# Patient Record
Sex: Male | Born: 1970 | Race: Black or African American | Hispanic: No | Marital: Single | Smoking: Never smoker
Health system: Southern US, Community
[De-identification: ages and names within clinical notes are randomized; demographics above are authoritative.]

## PROBLEM LIST (undated history)

## (undated) DIAGNOSIS — K429 Umbilical hernia without obstruction or gangrene: Secondary | ICD-10-CM

## (undated) DIAGNOSIS — R011 Cardiac murmur, unspecified: Secondary | ICD-10-CM

## (undated) DIAGNOSIS — J45909 Unspecified asthma, uncomplicated: Secondary | ICD-10-CM

## (undated) DIAGNOSIS — T7840XA Allergy, unspecified, initial encounter: Secondary | ICD-10-CM

## (undated) DIAGNOSIS — R0683 Snoring: Secondary | ICD-10-CM

## (undated) HISTORY — PX: FOOT SURGERY: SHX648

## (undated) HISTORY — DX: Cardiac murmur, unspecified: R01.1

## (undated) HISTORY — DX: Allergy, unspecified, initial encounter: T78.40XA

## (undated) HISTORY — PX: HERNIA REPAIR: SHX51

## (undated) HISTORY — DX: Unspecified asthma, uncomplicated: J45.909

---

## 2001-04-02 ENCOUNTER — Emergency Department (HOSPITAL_COMMUNITY): Admission: EM | Admit: 2001-04-02 | Discharge: 2001-04-02 | Payer: Self-pay | Admitting: Emergency Medicine

## 2001-04-02 ENCOUNTER — Encounter: Payer: Self-pay | Admitting: Emergency Medicine

## 2001-04-11 ENCOUNTER — Emergency Department (HOSPITAL_COMMUNITY): Admission: EM | Admit: 2001-04-11 | Discharge: 2001-04-11 | Payer: Self-pay | Admitting: Emergency Medicine

## 2007-12-25 ENCOUNTER — Emergency Department (HOSPITAL_COMMUNITY): Admission: EM | Admit: 2007-12-25 | Discharge: 2007-12-25 | Payer: Self-pay | Admitting: Emergency Medicine

## 2010-07-08 ENCOUNTER — Ambulatory Visit
Admission: RE | Admit: 2010-07-08 | Discharge: 2010-07-08 | Disposition: A | Discharge status: Home / Self Care | Payer: BC Managed Care – PPO | Source: Ambulatory Visit | Attending: Allergy | Admitting: Allergy

## 2010-07-08 ENCOUNTER — Other Ambulatory Visit: Payer: Self-pay | Admitting: Allergy

## 2010-07-08 DIAGNOSIS — J45909 Unspecified asthma, uncomplicated: Secondary | ICD-10-CM

## 2011-03-28 ENCOUNTER — Ambulatory Visit (INDEPENDENT_AMBULATORY_CARE_PROVIDER_SITE_OTHER): Payer: Self-pay | Admitting: Surgery

## 2011-05-02 ENCOUNTER — Other Ambulatory Visit: Payer: Self-pay | Admitting: Urology

## 2011-05-02 DIAGNOSIS — D352 Benign neoplasm of pituitary gland: Secondary | ICD-10-CM

## 2011-05-10 ENCOUNTER — Other Ambulatory Visit (HOSPITAL_COMMUNITY): Payer: BC Managed Care – PPO

## 2011-05-10 ENCOUNTER — Ambulatory Visit (HOSPITAL_COMMUNITY)
Admission: RE | Admit: 2011-05-10 | Discharge: 2011-05-10 | Disposition: A | Discharge status: Home / Self Care | Payer: BC Managed Care – PPO | Source: Ambulatory Visit | Attending: Urology | Admitting: Urology

## 2011-05-10 DIAGNOSIS — D352 Benign neoplasm of pituitary gland: Secondary | ICD-10-CM

## 2011-05-10 DIAGNOSIS — H539 Unspecified visual disturbance: Secondary | ICD-10-CM | POA: Insufficient documentation

## 2011-05-10 DIAGNOSIS — R259 Unspecified abnormal involuntary movements: Secondary | ICD-10-CM | POA: Insufficient documentation

## 2011-05-10 MED ORDER — GADOBENATE DIMEGLUMINE 529 MG/ML IV SOLN
15.0000 mL | Freq: Once | INTRAVENOUS | Status: AC | PRN
  Administered 2011-05-10: 15 mL via INTRAVENOUS

## 2011-05-10 MED ORDER — GADOBENATE DIMEGLUMINE 529 MG/ML IV SOLN: 20.0000 mL | Freq: Once | INTRAVENOUS | Status: AC | PRN

## 2011-11-09 ENCOUNTER — Other Ambulatory Visit: Payer: Self-pay | Admitting: Neurosurgery

## 2011-11-09 DIAGNOSIS — D353 Benign neoplasm of craniopharyngeal duct: Secondary | ICD-10-CM

## 2013-04-22 ENCOUNTER — Ambulatory Visit (INDEPENDENT_AMBULATORY_CARE_PROVIDER_SITE_OTHER): Payer: BC Managed Care – PPO | Admitting: Family Medicine

## 2013-04-22 VITALS — BP 110/70 | HR 61 | Temp 98.3°F | Resp 16 | Ht 71.5 in | Wt 222.0 lb

## 2013-04-22 DIAGNOSIS — J45909 Unspecified asthma, uncomplicated: Secondary | ICD-10-CM | POA: Insufficient documentation

## 2013-04-22 DIAGNOSIS — J069 Acute upper respiratory infection, unspecified: Secondary | ICD-10-CM

## 2013-04-22 MED ORDER — HYDROCOD POLST-CHLORPHEN POLST 10-8 MG/5ML PO LQCR: 5.0000 mL | Freq: Every evening | ORAL | Status: DC | PRN

## 2013-04-22 MED ORDER — CEFDINIR 300 MG PO CAPS: 300.0000 mg | ORAL_CAPSULE | Freq: Two times a day (BID) | ORAL | Status: DC

## 2013-04-22 NOTE — Progress Notes (Signed)
I have discussed this case with Ms. Gessner, NP and agree.  

## 2013-04-22 NOTE — Progress Notes (Signed)
   Subjective:    Patient ID: Tony Owens, male    DOB: 09-19-1970, 43 y.o.   MRN: [id number]  HPI Patient presents today with 6 day history of cough and runny nose. Productive cough with green sputum and green/bloody nasal drainage. Has felt dizzy. Nausea and vomiting x 3 yesterday. This occurred after taking Delsym yesterday, was able to eat and drink normally follow the vomiting. No nausea or vomiting today. Chills x 2 days.  Has had asthma for 3-4 years. Uses albuterol inhaler infrequently. Sees Dr. Romie Owens for weekly allergy injections. Has not been using Advair regularly.   Review of Systems Unknown fever, Headaches above eyes, no ear pain. SOB with wheezing- did not use inhaler.    Objective:   Physical Exam  Vitals reviewed. Constitutional: He is oriented to person, place, and time. He appears well-developed and well-nourished.  HENT:  Head: Normocephalic and atraumatic.  Right Ear: Hearing, tympanic membrane, external ear and ear canal normal.  Left Ear: Hearing, tympanic membrane, external ear and ear canal normal.  Nose: Mucosal edema and rhinorrhea present. Right sinus exhibits no maxillary sinus tenderness and no frontal sinus tenderness. Left sinus exhibits no maxillary sinus tenderness and no frontal sinus tenderness.  Mouth/Throat: Uvula is midline and mucous membranes are normal. Posterior oropharyngeal erythema present. No oropharyngeal exudate or posterior oropharyngeal edema.  Eyes: Conjunctivae are normal. Right eye exhibits no discharge. Left eye exhibits no discharge.  Neck: Normal range of motion. Neck supple.  Cardiovascular: Normal rate, regular rhythm and normal heart sounds.   Pulmonary/Chest: Effort normal and breath sounds normal.  Musculoskeletal: Normal range of motion.  Lymphadenopathy:    He has cervical adenopathy.  Neurological: He is alert and oriented to person, place, and time.  Skin: Skin is warm and dry.  Psychiatric: He has a normal mood and  affect. His behavior is normal. Judgment and thought content normal.      Assessment & Plan:  1. Upper respiratory infection - chlorpheniramine-HYDROcodone (TUSSIONEX PENNKINETIC ER) 10-8 MG/5ML LQCR; Take 5 mLs by mouth at bedtime as needed for cough (cough).  Dispense: 70 mL; Refill: 0 - cefdinir (OMNICEF) 300 MG capsule; Take 1 capsule (300 mg total) by mouth 2 (two) times daily.  Dispense: 20 capsule; Refill: 0 -continue Mucinex 2. Asthma, chronic -Provided written and verbal information regarding Asthma. Discussed albuterol and Advair use. Encouraged patient to use Advair daily and to use albuterol prn wheezing and cough (esp. Associated with exercise and with upper respiratory infection) -Follow up if worsening symptoms or no improvement in 5-7 days.  Tony Beck, FNP-BC  [facility]  04/22/2013 9:35 AM

## 2013-04-22 NOTE — Patient Instructions (Signed)
Asthma, Adult Asthma is a recurring condition in which the airways tighten and narrow. Asthma can make it difficult to breathe. It can cause coughing, wheezing, and shortness of breath. Asthma episodes (also called asthma attacks) range from minor to life-threatening. Asthma cannot be cured, but medicines and lifestyle changes can help control it. CAUSES Asthma is believed to be caused by inherited (genetic) and environmental factors, but its exact cause is unknown. Asthma may be triggered by allergens, lung infections, or irritants in the air. Asthma triggers are different for each person. Common triggers include:   Animal dander.  Dust mites.  Cockroaches.  Pollen from trees or grass.  Mold.  Smoke.  Air pollutants such as dust, household cleaners, hair sprays, aerosol sprays, paint fumes, strong chemicals, or strong odors.  Cold air, weather changes, and winds (which increase molds and pollens in the air).  Strong emotional expressions such as crying or laughing hard.  Stress.  Certain medicines (such as aspirin) or types of drugs (such as beta-blockers).  Sulfites in foods and drinks. Foods and drinks that may contain sulfites include dried fruit, potato chips, and sparkling grape juice.  Infections or inflammatory conditions such as the flu, a cold, or an inflammation of the nasal membranes (rhinitis).  Gastroesophageal reflux disease (GERD).  Exercise or strenuous activity. SYMPTOMS Symptoms may occur immediately after asthma is triggered or many hours later. Symptoms include:  Wheezing.  Excessive nighttime or early morning coughing.  Frequent or severe coughing with a common cold.  Chest tightness.  Shortness of breath. DIAGNOSIS  The diagnosis of asthma is made by a review of your medical history and a physical exam. Tests may also be performed. These may include:  Lung function studies. These tests show how much air you breath in and out.  Allergy  tests.  Imaging tests such as X-rays. TREATMENT  Asthma cannot be cured, but it can usually be controlled. Treatment involves identifying and avoiding your asthma triggers. It also involves medicines. There are 2 classes of medicine used for asthma treatment:   Controller medicines. These prevent asthma symptoms from occurring. They are usually taken every day.  Reliever or rescue medicines. These quickly relieve asthma symptoms. They are used as needed and provide short-term relief. Your health care provider will help you create an asthma action plan. An asthma action plan is a written plan for managing and treating your asthma attacks. It includes a list of your asthma triggers and how they may be avoided. It also includes information on when medicines should be taken and when their dosage should be changed. An action plan may also involve the use of a device called a peak flow meter. A peak flow meter measures how well the lungs are working. It helps you monitor your condition. HOME CARE INSTRUCTIONS   Take medicine as directed by your health care provider. Speak with your health care provider if you have questions about how or when to take the medicines.  Use a peak flow meter as directed by your health care provider. Record and keep track of readings.  Understand and use the action plan to help minimize or stop an asthma attack without needing to seek medical care.  Control your home environment in the following ways to help prevent asthma attacks:  Do not smoke. Avoid being exposed to secondhand smoke.  Change your heating and air conditioning filter regularly.  Limit your use of fireplaces and wood stoves.  Get rid of pests (such as roaches and   mice) and their droppings.  Throw away plants if you see mold on them.  Clean your floors and dust regularly. Use unscented cleaning products.  Try to have someone else vacuum for you regularly. Stay out of rooms while they are being  vacuumed and for a short while afterward. If you vacuum, use a dust mask from a hardware store, a double-layered or microfilter vacuum cleaner bag, or a vacuum cleaner with a HEPA filter.  Replace carpet with wood, tile, or vinyl flooring. Carpet can trap dander and dust.  Use allergy-proof pillows, mattress covers, and box spring covers.  Wash bed sheets and blankets every week in hot water and dry them in a dryer.  Use blankets that are made of polyester or cotton.  Clean bathrooms and kitchens with bleach. If possible, have someone repaint the walls in these rooms with mold-resistant paint. Keep out of the rooms that are being cleaned and painted.  Wash hands frequently. SEEK MEDICAL CARE IF:   You have wheezing, shortness of breath, or a cough even if taking medicine to prevent attacks.  The colored mucus you cough up (sputum) is thicker than usual.  Your sputum changes from clear or white to yellow, green, gray, or bloody.  You have any problems that may be related to the medicines you are taking (such as a rash, itching, swelling, or trouble breathing).  You are using a reliever medicine more than 2 3 times per week.  Your peak flow is still at 50 79% of you personal best after following your action plan for 1 hour. SEEK IMMEDIATE MEDICAL CARE IF:   You seem to be getting worse and are unresponsive to treatment during an asthma attack.  You are short of breath even at rest.  You get short of breath when doing very little physical activity.  You have difficulty eating, drinking, or talking due to asthma symptoms.  You develop chest pain.  You develop a fast heartbeat.  You have a bluish color to your lips or fingernails.  You are lightheaded, dizzy, or faint.  Your peak flow is less than 50% of your personal best.  You have a fever or persistent symptoms for more than 2 3 days.  You have a fever and symptoms suddenly get worse. MAKE SURE YOU:   Understand these  instructions.  Will watch your condition.  Will get help right away if you are not doing well or get worse. Document Released: 01/10/2005 Document Revised: 09/12/2012 Document Reviewed: 08/09/2012 [facility] Patient Information 2014 [place], [place]. Upper Respiratory Infection, Adult An upper respiratory infection (URI) is also sometimes known as the common cold. The upper respiratory tract includes the nose, sinuses, throat, trachea, and bronchi. Bronchi are the airways leading to the lungs. Most people improve within 1 week, but symptoms can last up to 2 weeks. A residual cough may last even longer.  CAUSES Many different viruses can infect the tissues lining the upper respiratory tract. The tissues become irritated and inflamed and often become very moist. Mucus production is also common. A cold is contagious. You can easily spread the virus to others by oral contact. This includes kissing, sharing a glass, coughing, or sneezing. Touching your mouth or nose and then touching a surface, which is then touched by another person, can also spread the virus. SYMPTOMS  Symptoms typically develop 1 to 3 days after you come in contact with a cold virus. Symptoms vary from person to person. They may include:  Runny nose.  Sneezing.  Nasal congestion.  Sinus irritation.  Sore throat.  Loss of voice (laryngitis).  Cough.  Fatigue.  Muscle aches.  Loss of appetite.  Headache.  Low-grade fever. DIAGNOSIS  You might diagnose your own cold based on familiar symptoms, since most people get a cold 2 to 3 times a year. Your caregiver can confirm this based on your exam. Most importantly, your caregiver can check that your symptoms are not due to another disease such as strep throat, sinusitis, pneumonia, asthma, or epiglottitis. Blood tests, throat tests, and X-rays are not necessary to diagnose a common cold, but they may sometimes be helpful in excluding other more serious diseases. Your  caregiver will decide if any further tests are required. RISKS AND COMPLICATIONS  You may be at risk for a more severe case of the common cold if you smoke cigarettes, have chronic heart disease (such as heart failure) or lung disease (such as asthma), or if you have a weakened immune system. The very young and very old are also at risk for more serious infections. Bacterial sinusitis, middle ear infections, and bacterial pneumonia can complicate the common cold. The common cold can worsen asthma and chronic obstructive pulmonary disease (COPD). Sometimes, these complications can require emergency medical care and may be life-threatening. PREVENTION  The best way to protect against getting a cold is to practice good hygiene. Avoid oral or hand contact with people with cold symptoms. Wash your hands often if contact occurs. There is no clear evidence that vitamin C, vitamin E, echinacea, or exercise reduces the chance of developing a cold. However, it is always recommended to get plenty of rest and practice good nutrition. TREATMENT  Treatment is directed at relieving symptoms. There is no cure. Antibiotics are not effective, because the infection is caused by a virus, not by bacteria. Treatment may include:  Increased fluid intake. Sports drinks offer valuable electrolytes, sugars, and fluids.  Breathing heated mist or steam (vaporizer or shower).  Eating chicken soup or other clear broths, and maintaining good nutrition.  Getting plenty of rest.  Using gargles or lozenges for comfort.  Controlling fevers with ibuprofen or acetaminophen as directed by your caregiver.  Increasing usage of your inhaler if you have asthma. Zinc gel and zinc lozenges, taken in the first 24 hours of the common cold, can shorten the duration and lessen the severity of symptoms. Pain medicines may help with fever, muscle aches, and throat pain. A variety of non-prescription medicines are available to treat congestion  and runny nose. Your caregiver can make recommendations and may suggest nasal or lung inhalers for other symptoms.  HOME CARE INSTRUCTIONS   Only take over-the-counter or prescription medicines for pain, discomfort, or fever as directed by your caregiver.  Use a warm mist humidifier or inhale steam from a shower to increase air moisture. This may keep secretions moist and make it easier to breathe.  Drink enough water and fluids to keep your urine clear or pale yellow.  Rest as needed.  Return to work when your temperature has returned to normal or as your caregiver advises. You may need to stay home longer to avoid infecting others. You can also use a face mask and careful hand washing to prevent spread of the virus. SEEK MEDICAL CARE IF:   After the first few days, you feel you are getting worse rather than better.  You need your caregiver's advice about medicines to control symptoms.  You develop chills, worsening shortness of breath, or  brown or red sputum. These may be signs of pneumonia.  You develop yellow or brown nasal discharge or pain in the face, especially when you bend forward. These may be signs of sinusitis.  You develop a fever, swollen neck glands, pain with swallowing, or white areas in the back of your throat. These may be signs of strep throat. SEEK IMMEDIATE MEDICAL CARE IF:   You have a fever.  You develop severe or persistent headache, ear pain, sinus pain, or chest pain.  You develop wheezing, a prolonged cough, cough up blood, or have a change in your usual mucus (if you have chronic lung disease).  You develop sore muscles or a stiff neck. Document Released: 07/06/2000 Document Revised: 04/04/2011 Document Reviewed: 05/14/2010 [facility] Patient Information 2014 [place], [place].

## 2014-06-16 ENCOUNTER — Ambulatory Visit: Payer: Self-pay | Admitting: Surgery

## 2014-06-16 NOTE — H&P (Signed)
Tony Owens 06/16/2014 3:04 PM Location: [facility] Patient #: [id number] DOB: [birth year] Single / Language: Tony Owens / Race: Black or African American Male History of Present Illness [name] A. Tiari Andringa MD; 06/16/2014 3:23 PM) Patient words: umbilical hernia   Pt sent at the request of Dr Nancy Fetter for umbilical hernia. It has been present for many years but now is causing mild to moderate pain with exertion.Pain described as burning but is very active and not slowing him down much. He has a bulge at his umbilicus. It is slowly getting larger. Some diarhea associated with his hernia. No vomiting.  The patient is a 44 year old male   Other Problems Tony Owens, [place]; 06/16/2014 3:05 PM) Asthma Back Pain Gastroesophageal Reflux Disease Heart murmur Inguinal Hernia Sleep Apnea  Past Surgical History Tony Owens, CMA; 06/16/2014 3:05 PM) Foot Surgery Right.  Diagnostic Studies History Tony Owens, [place]; 06/16/2014 3:05 PM) Colonoscopy never  Allergies Tony Owens, [place]; 06/16/2014 3:05 PM) No Known Drug Allergies 06/16/2014  Medication History Tony Owens, [place]; 06/16/2014 3:05 PM) Advair HFA (115-21MCG/ACT Aerosol, Inhalation) Active. Proventil HFA (108 (90 Base)MCG/ACT Aerosol Soln, Inhalation) Active.  Social History Tony Owens, [place]; 06/16/2014 3:05 PM) Alcohol use Remotely quit alcohol use. No caffeine use No drug use Tobacco use Never smoker.  Family History Tony Owens, [place]; 06/16/2014 3:05 PM) Colon Cancer Father. Diabetes Mellitus Brother. Thyroid problems Sister.     Review of Systems ([place]. [name]; 06/16/2014 3:05 PM) General Present- Fatigue and Night Sweats. Not Present- Appetite Loss, Chills, Fever, Weight Gain and Weight Loss. Skin Not Present- Change in Wart/Mole, Dryness, Hives, Jaundice, New Lesions, Non-Healing Wounds, Rash and Ulcer. HEENT Present- Nose Bleed, Seasonal  Allergies and Sore Throat. Not Present- Earache, Hearing Loss, Hoarseness, Oral Ulcers, Ringing in the Ears, Sinus Pain, Visual Disturbances, Wears glasses/contact lenses and Yellow Eyes. Respiratory Present- Chronic Cough and Snoring. Not Present- Bloody sputum, Difficulty Breathing and Wheezing. Cardiovascular Present- Leg Cramps and Shortness of Breath. Not Present- Chest Pain, Difficulty Breathing Lying Down, Palpitations, Rapid Heart Rate and Swelling of Extremities. Gastrointestinal Present- Abdominal Pain and Bloating. Not Present- Bloody Stool, Change in Bowel Habits, Chronic diarrhea, Constipation, Difficulty Swallowing, Excessive gas, Gets full quickly at meals, Hemorrhoids, Indigestion, Nausea, Rectal Pain and Vomiting. Male Genitourinary Not Present- Blood in Urine, Change in Urinary Stream, Frequency, Impotence, Nocturia, Painful Urination, Urgency and Urine Leakage. Musculoskeletal Present- Back Pain and Joint Pain. Not Present- Joint Stiffness, Muscle Pain, Muscle Weakness and Swelling of Extremities. Neurological Present- Decreased Memory and Headaches. Not Present- Fainting, Numbness, Seizures, Tingling, Tremor, Trouble walking and Weakness. Psychiatric Not Present- Anxiety, Bipolar, Change in Sleep Pattern, Depression, Fearful and Frequent crying. Endocrine Present- Cold Intolerance. Not Present- Excessive Hunger, Hair Changes, Heat Intolerance, Hot flashes and New Diabetes.  Vitals [employer] R. [name]; 06/16/2014 3:05 PM) 06/16/2014 3:05 PM Weight: 213 lb Height: 71in Body Surface Area: 2.2 m Body Mass Index: 29.71 kg/m BP: 124/80 (Sitting, Left Arm, Standard)     Physical Exam ([name] A. [name] MD; 06/16/2014 3:20 PM)  Eye Eyeball - Bilateral-Extraocular movements intact. Sclera/Conjunctiva - Bilateral-No scleral icterus.  Chest and Lung Exam Chest and lung exam reveals -quiet, even and easy respiratory effort with no use of accessory muscles and on  auscultation, normal breath sounds, no adventitious sounds and normal vocal resonance. Inspection Chest Wall - Normal. Back - normal.  Cardiovascular Cardiovascular examination reveals -normal heart sounds, regular rate and rhythm with no murmurs  and normal pedal pulses bilaterally.  Abdomen Note: umbilical hernia reducible second bulge just to the left of umbilicus that could be a second hernia soft reducible    Neurologic Neurologic evaluation reveals -alert and oriented x 3 with no impairment of recent or remote memory. Mental Status-Normal.  Musculoskeletal Normal Exam - Left-Upper Extremity Strength Normal and Lower Extremity Strength Normal. Normal Exam - Right-Upper Extremity Strength Normal and Lower Extremity Strength Normal.    Assessment & Plan ([name] A. Myrel Rappleye MD; 07/24/4101 0:13 PM)  UMBILICAL HERNIA WITHOUT OBSTRUCTION AND WITHOUT GANGRENE (553.1  K42.9) Impression: DISCUSED OBSERVATION VS REPAIR WITH MESH. PT DESIRES REPAIR. The risk of hernia repair include bleeding, infection, organ injury, bowel injury, bladder injury, nerve injury recurrent hernia, blood clots, worsening of underlying condition, chronic pain, mesh use, open surgery, death, and the need for other operattions. Pt agrees to proceed  Current Plans The anatomy and the physiology was discussed. The pathophysiology and natural history of the disease was discussed. Options were discussed and recommendations were made. Technique, risks, benefits, & alternatives were discussed. Risks such as stroke, heart attack, bleeding, indection, death, and other risks discussed. Questions answered. The patient agrees to proceed. Pt Education - [facility] Umbilical/ Inguinal Hernia HCI

## 2014-06-27 ENCOUNTER — Encounter (HOSPITAL_BASED_OUTPATIENT_CLINIC_OR_DEPARTMENT_OTHER): Payer: Self-pay | Admitting: *Deleted

## 2014-07-02 ENCOUNTER — Ambulatory Visit (HOSPITAL_BASED_OUTPATIENT_CLINIC_OR_DEPARTMENT_OTHER)
Admission: RE | Admit: 2014-07-02 | Discharge: 2014-07-02 | Disposition: A | Discharge status: Home / Self Care | Payer: Worker's Compensation | Source: Ambulatory Visit | Attending: Surgery | Admitting: Surgery

## 2014-07-02 ENCOUNTER — Ambulatory Visit (HOSPITAL_BASED_OUTPATIENT_CLINIC_OR_DEPARTMENT_OTHER): Payer: Worker's Compensation | Admitting: Anesthesiology

## 2014-07-02 ENCOUNTER — Encounter (HOSPITAL_BASED_OUTPATIENT_CLINIC_OR_DEPARTMENT_OTHER): Payer: Self-pay

## 2014-07-02 ENCOUNTER — Encounter (HOSPITAL_BASED_OUTPATIENT_CLINIC_OR_DEPARTMENT_OTHER)
Admission: RE | Disposition: A | Discharge status: Home / Self Care | Payer: Self-pay | Source: Ambulatory Visit | Attending: Surgery

## 2014-07-02 DIAGNOSIS — Z79899 Other long term (current) drug therapy: Secondary | ICD-10-CM | POA: Insufficient documentation

## 2014-07-02 DIAGNOSIS — J45909 Unspecified asthma, uncomplicated: Secondary | ICD-10-CM | POA: Diagnosis not present

## 2014-07-02 DIAGNOSIS — G4730 Sleep apnea, unspecified: Secondary | ICD-10-CM | POA: Insufficient documentation

## 2014-07-02 DIAGNOSIS — K429 Umbilical hernia without obstruction or gangrene: Secondary | ICD-10-CM | POA: Diagnosis present

## 2014-07-02 DIAGNOSIS — K219 Gastro-esophageal reflux disease without esophagitis: Secondary | ICD-10-CM | POA: Diagnosis not present

## 2014-07-02 DIAGNOSIS — Z7951 Long term (current) use of inhaled steroids: Secondary | ICD-10-CM | POA: Diagnosis not present

## 2014-07-02 DIAGNOSIS — Z9889 Other specified postprocedural states: Secondary | ICD-10-CM

## 2014-07-02 HISTORY — DX: Umbilical hernia without obstruction or gangrene: K42.9

## 2014-07-02 HISTORY — PX: UMBILICAL HERNIA REPAIR: SHX196

## 2014-07-02 HISTORY — PX: INSERTION OF MESH: SHX5868

## 2014-07-02 HISTORY — DX: Snoring: R06.83

## 2014-07-02 LAB — POCT HEMOGLOBIN-HEMACUE: Hemoglobin: 16.4 g/dL (ref 13.0–17.0)

## 2014-07-02 SURGERY — REPAIR, HERNIA, UMBILICAL, ADULT
Anesthesia: General | Site: Abdomen

## 2014-07-02 MED ORDER — HYDROMORPHONE HCL 1 MG/ML IJ SOLN
INTRAMUSCULAR | Status: AC
  Filled 2014-07-02: qty 1

## 2014-07-02 MED ORDER — CEFAZOLIN SODIUM-DEXTROSE 2-3 GM-% IV SOLR
INTRAVENOUS | Status: AC
  Filled 2014-07-02: qty 50

## 2014-07-02 MED ORDER — OXYCODONE HCL 5 MG PO TABS
5.0000 mg | ORAL_TABLET | Freq: Once | ORAL | Status: AC | PRN
  Administered 2014-07-02: 5 mg via ORAL

## 2014-07-02 MED ORDER — CEFAZOLIN SODIUM-DEXTROSE 2-3 GM-% IV SOLR
INTRAVENOUS | Status: DC | PRN
  Administered 2014-07-02: 2 g via INTRAVENOUS

## 2014-07-02 MED ORDER — DEXAMETHASONE SODIUM PHOSPHATE 4 MG/ML IJ SOLN
INTRAMUSCULAR | Status: DC | PRN
  Administered 2014-07-02: 10 mg via INTRAVENOUS

## 2014-07-02 MED ORDER — HYDROMORPHONE HCL 1 MG/ML IJ SOLN
0.2500 mg | INTRAMUSCULAR | Status: DC | PRN
  Administered 2014-07-02: 0.5 mg via INTRAVENOUS

## 2014-07-02 MED ORDER — MEPERIDINE HCL 25 MG/ML IJ SOLN: 6.2500 mg | INTRAMUSCULAR | Status: DC | PRN

## 2014-07-02 MED ORDER — LIDOCAINE HCL (CARDIAC) 20 MG/ML IV SOLN
INTRAVENOUS | Status: DC | PRN
  Administered 2014-07-02: 80 mg via INTRAVENOUS

## 2014-07-02 MED ORDER — ONDANSETRON HCL 4 MG/2ML IJ SOLN
INTRAMUSCULAR | Status: DC | PRN
  Administered 2014-07-02: 4 mg via INTRAVENOUS

## 2014-07-02 MED ORDER — OXYCODONE HCL 5 MG/5ML PO SOLN: 5.0000 mg | Freq: Once | ORAL | Status: AC | PRN

## 2014-07-02 MED ORDER — MIDAZOLAM HCL 2 MG/2ML IJ SOLN
INTRAMUSCULAR | Status: AC
  Filled 2014-07-02: qty 2

## 2014-07-02 MED ORDER — FENTANYL CITRATE (PF) 100 MCG/2ML IJ SOLN
50.0000 ug | INTRAMUSCULAR | Status: DC | PRN
  Administered 2014-07-02: 100 ug via INTRAVENOUS
  Administered 2014-07-02 (×2): 50 ug via INTRAVENOUS

## 2014-07-02 MED ORDER — BUPIVACAINE-EPINEPHRINE 0.25% -1:200000 IJ SOLN
INTRAMUSCULAR | Status: DC | PRN
  Administered 2014-07-02: 9 mL

## 2014-07-02 MED ORDER — OXYCODONE HCL 5 MG PO TABS
ORAL_TABLET | ORAL | Status: AC
  Filled 2014-07-02: qty 1

## 2014-07-02 MED ORDER — LIDOCAINE HCL 4 % MT SOLN
OROMUCOSAL | Status: DC | PRN
  Administered 2014-07-02: 3 mL via TOPICAL

## 2014-07-02 MED ORDER — FENTANYL CITRATE (PF) 100 MCG/2ML IJ SOLN
INTRAMUSCULAR | Status: AC
  Filled 2014-07-02: qty 6

## 2014-07-02 MED ORDER — GLYCOPYRROLATE 0.2 MG/ML IJ SOLN
0.2000 mg | Freq: Once | INTRAMUSCULAR | Status: AC | PRN
  Administered 2014-07-02: 0.4 mg via INTRAVENOUS

## 2014-07-02 MED ORDER — LACTATED RINGERS IV SOLN
INTRAVENOUS | Status: DC
  Administered 2014-07-02 (×2): via INTRAVENOUS

## 2014-07-02 MED ORDER — MIDAZOLAM HCL 2 MG/2ML IJ SOLN
1.0000 mg | INTRAMUSCULAR | Status: DC | PRN
  Administered 2014-07-02: 2 mg via INTRAVENOUS

## 2014-07-02 MED ORDER — OXYCODONE-ACETAMINOPHEN 5-325 MG PO TABS: 1.0000 | ORAL_TABLET | ORAL | Status: AC | PRN

## 2014-07-02 MED ORDER — PROPOFOL 10 MG/ML IV BOLUS
INTRAVENOUS | Status: DC | PRN
  Administered 2014-07-02: 350 mg via INTRAVENOUS

## 2014-07-02 MED ORDER — ROCURONIUM BROMIDE 100 MG/10ML IV SOLN
INTRAVENOUS | Status: DC | PRN
Start: 2014-07-02 — End: 2014-07-02
  Administered 2014-07-02: 10 mg via INTRAVENOUS
  Administered 2014-07-02: 40 mg via INTRAVENOUS

## 2014-07-02 MED ORDER — NEOSTIGMINE METHYLSULFATE 10 MG/10ML IV SOLN
INTRAVENOUS | Status: DC | PRN
  Administered 2014-07-02: 3 mg via INTRAVENOUS

## 2014-07-02 SURGICAL SUPPLY — 50 items
APL SKNCLS STERI-STRIP NONHPOA (GAUZE/BANDAGES/DRESSINGS)
BENZOIN TINCTURE PRP APPL 2/3 (GAUZE/BANDAGES/DRESSINGS) IMPLANT
BLADE CLIPPER SURG (BLADE) IMPLANT
BLADE SURG 10 STRL SS (BLADE) IMPLANT
BLADE SURG 15 STRL LF DISP TIS (BLADE) ×1 IMPLANT
BLADE SURG 15 STRL SS (BLADE) ×2
CANISTER SUCT 1200ML W/VALVE (MISCELLANEOUS) IMPLANT
CHLORAPREP W/TINT 26ML (MISCELLANEOUS) ×2 IMPLANT
COVER BACK TABLE 60X90IN (DRAPES) ×2 IMPLANT
COVER MAYO STAND STRL (DRAPES) ×2 IMPLANT
COVER SURGICAL LIGHT HANDLE (MISCELLANEOUS) ×1 IMPLANT
DECANTER SPIKE VIAL GLASS SM (MISCELLANEOUS) IMPLANT
DRAPE LAPAROTOMY TRNSV 102X78 (DRAPE) ×2 IMPLANT
DRAPE UTILITY XL STRL (DRAPES) ×2 IMPLANT
DRSG TEGADERM 4X4.75 (GAUZE/BANDAGES/DRESSINGS) IMPLANT
ELECT COATED BLADE 2.86 ST (ELECTRODE) ×2 IMPLANT
ELECT REM PT RETURN 9FT ADLT (ELECTROSURGICAL) ×2
ELECTRODE REM PT RTRN 9FT ADLT (ELECTROSURGICAL) ×1 IMPLANT
GLOVE BIOGEL M STRL SZ7.5 (GLOVE) ×1 IMPLANT
GLOVE BIOGEL PI IND STRL 8 (GLOVE) ×1 IMPLANT
GLOVE BIOGEL PI INDICATOR 8 (GLOVE) ×4
GLOVE ECLIPSE 8.0 STRL XLNG CF (GLOVE) ×2 IMPLANT
GLOVE SURG SS PI 7.5 STRL IVOR (GLOVE) ×1 IMPLANT
GOWN STRL REUS W/ TWL LRG LVL3 (GOWN DISPOSABLE) ×2 IMPLANT
GOWN STRL REUS W/ TWL XL LVL3 (GOWN DISPOSABLE) IMPLANT
GOWN STRL REUS W/TWL LRG LVL3 (GOWN DISPOSABLE) ×2
GOWN STRL REUS W/TWL XL LVL3 (GOWN DISPOSABLE) ×4
LIQUID BAND (GAUZE/BANDAGES/DRESSINGS) ×2 IMPLANT
MESH VENTRALEX ST 2.5 CRC MED (Mesh General) ×1 IMPLANT
NDL HYPO 25X1 1.5 SAFETY (NEEDLE) ×1 IMPLANT
NEEDLE HYPO 22GX1.5 SAFETY (NEEDLE) IMPLANT
NEEDLE HYPO 25X1 1.5 SAFETY (NEEDLE) ×2 IMPLANT
NS IRRIG 1000ML POUR BTL (IV SOLUTION) IMPLANT
PACK BASIN DAY SURGERY FS (CUSTOM PROCEDURE TRAY) ×2 IMPLANT
PENCIL BUTTON HOLSTER BLD 10FT (ELECTRODE) ×2 IMPLANT
SLEEVE SCD COMPRESS KNEE MED (MISCELLANEOUS) ×2 IMPLANT
STAPLER VISISTAT 35W (STAPLE) IMPLANT
STRIP CLOSURE SKIN 1/2X4 (GAUZE/BANDAGES/DRESSINGS) IMPLANT
SUT MON AB 4-0 PC3 18 (SUTURE) ×2 IMPLANT
SUT NOVA NAB DX-16 0-1 5-0 T12 (SUTURE) ×3 IMPLANT
SUT NOVA NAB GS-22 2 0 T19 (SUTURE) IMPLANT
SUT SILK 3 0 SH 30 (SUTURE) IMPLANT
SUT VIC AB 2-0 SH 27 (SUTURE) ×2
SUT VIC AB 2-0 SH 27XBRD (SUTURE) ×1 IMPLANT
SUT VICRYL 3-0 CR8 SH (SUTURE) ×2 IMPLANT
SYR CONTROL 10ML LL (SYRINGE) ×2 IMPLANT
TOWEL OR 17X24 6PK STRL BLUE (TOWEL DISPOSABLE) ×2 IMPLANT
TOWEL OR NON WOVEN STRL DISP B (DISPOSABLE) ×2 IMPLANT
TUBE CONNECTING 20X1/4 (TUBING) IMPLANT
YANKAUER SUCT BULB TIP NO VENT (SUCTIONS) IMPLANT

## 2014-07-02 NOTE — Anesthesia Preprocedure Evaluation (Signed)
Anesthesia Evaluation  Patient identified by MRN, date of birth, ID band Patient awake    Reviewed: Allergy & Precautions, NPO status , Patient's Chart, lab work & pertinent test results  Airway Mallampati: I  TM Distance: >3 FB Neck ROM: Full    Dental  (+) Teeth Intact, Dental Advisory Given   Pulmonary  breath sounds clear to auscultation        Cardiovascular Rhythm:Regular Rate:Normal     Neuro/Psych    GI/Hepatic   Endo/Other    Renal/GU      Musculoskeletal   Abdominal   Peds  Hematology   Anesthesia Other Findings   Reproductive/Obstetrics                             Anesthesia Physical Anesthesia Plan  ASA: I  Anesthesia Plan: General   Post-op Pain Management:    Induction: Intravenous  Airway Management Planned: LMA  Additional Equipment:   Intra-op Plan:   Post-operative Plan: Extubation in OR  Informed Consent: I have reviewed the patients History and Physical, chart, labs and discussed the procedure including the risks, benefits and alternatives for the proposed anesthesia with the patient or authorized representative who has indicated his/her understanding and acceptance.   Dental advisory given  Plan Discussed with: CRNA, Anesthesiologist and Surgeon  Anesthesia Plan Comments:         Anesthesia Quick Evaluation

## 2014-07-02 NOTE — Anesthesia Postprocedure Evaluation (Signed)
  Anesthesia Post-op Note  Patient: Tony Owens  Procedure(s) Performed: Procedure(s): REPAIR OF UMBILICAL HERNIA WITH MESH (N/A) INSERTION OF MESH (N/A)  Patient Location: PACU  Anesthesia Type: General   Level of Consciousness: awake, alert  and oriented  Airway and Oxygen Therapy: Patient Spontanous Breathing  Post-op Pain: mild  Post-op Assessment: Post-op Vital signs reviewed  Post-op Vital Signs: Reviewed  Last Vitals:  Filed Vitals:   07/02/14 1700  BP:   Pulse: 80  Temp: 36.6 C  Resp: 16    Complications: No apparent anesthesia complications

## 2014-07-02 NOTE — H&P (View-Only) (Signed)
Tony Owens 06/16/2014 3:04 PM Location: [facility] Patient #: [id number] DOB: 07-22-1970 Single / Language: Tony Owens / Race: Black or African American Male History of Present Illness [name] A. Mariela Rex MD; 06/16/2014 3:23 PM) Patient words: umbilical hernia   Pt sent at the request of Dr Nancy Fetter for umbilical hernia. It has been present for many years but now is causing mild to moderate pain with exertion.Pain described as burning but is very active and not slowing him down much. He has a bulge at his umbilicus. It is slowly getting larger. Some diarhea associated with his hernia. No vomiting.  The patient is a 44 year old male   Other Problems [name], [place]; 06/16/2014 3:05 PM) Asthma Back Pain Gastroesophageal Reflux Disease Heart murmur Inguinal Hernia Sleep Apnea  Past Surgical History [name], CMA; 06/16/2014 3:05 PM) Foot Surgery Right.  Diagnostic Studies History [name], [place]; 06/16/2014 3:05 PM) Colonoscopy never  Allergies [name], [place]; 06/16/2014 3:05 PM) No Known Drug Allergies 06/16/2014  Medication History [name], [place]; 06/16/2014 3:05 PM) Advair HFA (115-21MCG/ACT Aerosol, Inhalation) Active. Proventil HFA (108 (90 Base)MCG/ACT Aerosol Soln, Inhalation) Active.  Social History [name], [place]; 06/16/2014 3:05 PM) Alcohol use Remotely quit alcohol use. No caffeine use No drug use Tobacco use Never smoker.  Family History [name], [place]; 06/16/2014 3:05 PM) Colon Cancer Father. Diabetes Mellitus Brother. Thyroid problems Sister.     Review of Systems (Polo. [name]; 06/16/2014 3:05 PM) General Present- Fatigue and Night Sweats. Not Present- Appetite Loss, Chills, Fever, Weight Gain and Weight Loss. Skin Not Present- Change in Wart/Mole, Dryness, Hives, Jaundice, New Lesions, Non-Healing Wounds, Rash and Ulcer. HEENT Present- Nose Bleed, Seasonal  Allergies and Sore Throat. Not Present- Earache, Hearing Loss, Hoarseness, Oral Ulcers, Ringing in the Ears, Sinus Pain, Visual Disturbances, Wears glasses/contact lenses and Yellow Eyes. Respiratory Present- Chronic Cough and Snoring. Not Present- Bloody sputum, Difficulty Breathing and Wheezing. Cardiovascular Present- Leg Cramps and Shortness of Breath. Not Present- Chest Pain, Difficulty Breathing Lying Down, Palpitations, Rapid Heart Rate and Swelling of Extremities. Gastrointestinal Present- Abdominal Pain and Bloating. Not Present- Bloody Stool, Change in Bowel Habits, Chronic diarrhea, Constipation, Difficulty Swallowing, Excessive gas, Gets full quickly at meals, Hemorrhoids, Indigestion, Nausea, Rectal Pain and Vomiting. Male Genitourinary Not Present- Blood in Urine, Change in Urinary Stream, Frequency, Impotence, Nocturia, Painful Urination, Urgency and Urine Leakage. Musculoskeletal Present- Back Pain and Joint Pain. Not Present- Joint Stiffness, Muscle Pain, Muscle Weakness and Swelling of Extremities. Neurological Present- Decreased Memory and Headaches. Not Present- Fainting, Numbness, Seizures, Tingling, Tremor, Trouble walking and Weakness. Psychiatric Not Present- Anxiety, Bipolar, Change in Sleep Pattern, Depression, Fearful and Frequent crying. Endocrine Present- Cold Intolerance. Not Present- Excessive Hunger, Hair Changes, Heat Intolerance, Hot flashes and New Diabetes.  Vitals [employer] R. [name]; 06/16/2014 3:05 PM) 06/16/2014 3:05 PM Weight: 213 lb Height: 71in Body Surface Area: 2.2 m Body Mass Index: 29.71 kg/m BP: 124/80 (Sitting, Left Arm, Standard)     Physical Exam (Iness Pangilinan A. Yamilett Anastos MD; 06/16/2014 3:20 PM)  Eye Eyeball - Bilateral-Extraocular movements intact. Sclera/Conjunctiva - Bilateral-No scleral icterus.  Chest and Lung Exam Chest and lung exam reveals -quiet, even and easy respiratory effort with no use of accessory muscles and on  auscultation, normal breath sounds, no adventitious sounds and normal vocal resonance. Inspection Chest Wall - Normal. Back - normal.  Cardiovascular Cardiovascular examination reveals -normal heart sounds, regular rate and rhythm with no murmurs  and normal pedal pulses bilaterally.  Abdomen Note: umbilical hernia reducible second bulge just to the left of umbilicus that could be a second hernia soft reducible    Neurologic Neurologic evaluation reveals -alert and oriented x 3 with no impairment of recent or remote memory. Mental Status-Normal.  Musculoskeletal Normal Exam - Left-Upper Extremity Strength Normal and Lower Extremity Strength Normal. Normal Exam - Right-Upper Extremity Strength Normal and Lower Extremity Strength Normal.    Assessment & Plan (Omar Gayden A. Benno Brensinger MD; 6/70/1410 3:01 PM)  UMBILICAL HERNIA WITHOUT OBSTRUCTION AND WITHOUT GANGRENE (553.1  K42.9) Impression: DISCUSED OBSERVATION VS REPAIR WITH MESH. PT DESIRES REPAIR. The risk of hernia repair include bleeding, infection, organ injury, bowel injury, bladder injury, nerve injury recurrent hernia, blood clots, worsening of underlying condition, chronic pain, mesh use, open surgery, death, and the need for other operattions. Pt agrees to proceed  Current Plans The anatomy and the physiology was discussed. The pathophysiology and natural history of the disease was discussed. Options were discussed and recommendations were made. Technique, risks, benefits, & alternatives were discussed. Risks such as stroke, heart attack, bleeding, indection, death, and other risks discussed. Questions answered. The patient agrees to proceed. Pt Education - [facility] Umbilical/ Inguinal Hernia HCI

## 2014-07-02 NOTE — Anesthesia Procedure Notes (Signed)
Procedure Name: Intubation Date/Time: 07/02/2014 2:35 PM Performed by: Maryella Shivers Pre-anesthesia Checklist: Patient identified, Emergency Drugs available, Suction available and Patient being monitored Patient Re-evaluated:Patient Re-evaluated prior to inductionOxygen Delivery Method: Circle System Utilized Preoxygenation: Pre-oxygenation with 100% oxygen Intubation Type: IV induction Ventilation: Mask ventilation without difficulty Laryngoscope Size: Mac and 3 Grade View: Grade I Tube type: Oral Tube size: 8.0 mm Number of attempts: 1 Airway Equipment and Method: Stylet and Oral airway Placement Confirmation: ETT inserted through vocal cords under direct vision,  positive ETCO2 and breath sounds checked- equal and bilateral Secured at: 23 cm Tube secured with: Tape Dental Injury: Teeth and Oropharynx as per pre-operative assessment

## 2014-07-02 NOTE — Op Note (Signed)
Nishant Sneath Feb 01, 1970 [id number] 07/02/2014  Preoperative diagnosis: umbilical hernia 3 cm reducible  Postoperative diagnosis: same  Procedure: Umbilical Hernia Repair with 6 cm bard  Coated mesh   Surgeon: Erroll Luna, MD, FACS  Anesthesia: General and 0.25 % marcaine with epinephrine    Clinical History and Indications: Patient presents for repair of umbilical hernia. Risks, benefits and alternatives to surgery were discussed.The risk of hernia repair include bleeding,  Infection,   Recurrence of the hernia,  Mesh use, chronic pain,  Organ injury,  Bowel injury,  Bladder injury,   nerve injury with numbness around the incision,  Death,  and worsening of preexisting  medical problems.  The alternatives to surgery have been discussed as well..  Long term expectations of both operative and non operative treatments have been discussed.   The patient agrees to proceed.  Procedure: The patient was seen in the preoperative area and the plans for the procedure reviewed again. He  had no further questions. I marked the area of the umbilicus as the operative site. He wishes to prodeed.  The patient was taken to the operating room and after satisfactory general anesthesia had been obtained the area was clipped as needed, prepped and draped. The timeout was performed.  I used some 0.25% Marcaine with epinephrine anesthesia to help with postoperative pain management. This was infiltrated around the umbilical area and additional infiltrated as I worked.  A curvilinear incision was made on the inferior aspect of the umbilicus. The umbilical skin was elevated off of the hernia sac. The hernia sac was dissected free of the subcutaneous tissues.  There was a second fascial defect 1 cm to the left the umbilicus I connected it to the umbilical defect. Preperitoneal fat was reduced back under the fascia. The defect measured 3 cm in maximal diameter. A 6 cm circular bard coated mesh was used. This is secured  to the undersurface the fascia with #1 Novafil. I then closed the fascia of the mesh with #1 Novafil. Hemostasis was complete.  Once the repair was complete the incision was closed by using some 3-0 Vicryl subcutaneous and 4-0 Monocryl subcuticular sutures. Liquid adhesive applied  The patient tolerated the procedure well. There were no operative complications. There was minimal blood loss. All counts were correct. He was taken to the PACU in satisfactory condition.  Erroll Luna, MD, FACS 07/02/2014 3:29 PM

## 2014-07-02 NOTE — Discharge Instructions (Signed)
[facility] _______[facility] ° °UMBILICAL OR INGUINAL HERNIA REPAIR: POST OP INSTRUCTIONS ° °Always review your discharge instruction sheet given to you by the facility where your surgery was performed. °IF YOU HAVE DISABILITY OR FAMILY LEAVE FORMS, YOU MUST BRING THEM TO THE OFFICE FOR PROCESSING.   °DO NOT GIVE THEM TO YOUR DOCTOR. ° °1. A  prescription for pain medication may be given to you upon discharge.  Take your pain medication as prescribed, if needed.  If narcotic pain medicine is not needed, then you may take acetaminophen (Tylenol) or ibuprofen (Advil) as needed. °2. Take your usually prescribed medications unless otherwise directed. °3. If you need a refill on your pain medication, please contact your pharmacy.  They will contact our office to request authorization. Prescriptions will not be filled after 5 pm or on week-ends. °4. You should follow a light diet the first 24 hours after arrival home, such as soup and crackers, etc.  Be sure to include lots of fluids daily.  Resume your normal diet the day after surgery. °5. Most patients will experience some swelling and bruising around the umbilicus or in the groin and scrotum.  Ice packs and reclining will help.  Swelling and bruising can take several days to resolve.  °6. It is common to experience some constipation if taking pain medication after surgery.  Increasing fluid intake and taking a stool softener (such as Colace) will usually help or prevent this problem from occurring.  A mild laxative (Milk of Magnesia or Miralax) should be taken according to package directions if there are no bowel movements after 48 hours. °7. Unless discharge instructions indicate otherwise, you may remove your bandages 24-48 hours after surgery, and you may shower at that time.  You may have steri-strips (small skin tapes) in place directly over the incision.  These strips should be left on the skin for 7-10 days.  If your surgeon used skin glue on the  incision, you may shower in 24 hours.  The glue will flake off over the next 2-3 weeks.  Any sutures or staples will be removed at the office during your follow-up visit. °8. ACTIVITIES:  You may resume regular (light) daily activities beginning the next day--such as daily self-care, walking, climbing stairs--gradually increasing activities as tolerated.  You may have sexual intercourse when it is comfortable.  Refrain from any heavy lifting or straining until approved by your doctor. °a. You may drive when you are no longer taking prescription pain medication, you can comfortably wear a seatbelt, and you can safely maneuver your car and apply brakes. °b. RETURN TO WORK:  __________________________________________________________ °9. You should see your doctor in the office for a follow-up appointment approximately 2-3 weeks after your surgery.  Make sure that you call for this appointment within a day or two after you arrive home to insure a convenient appointment time. °10. OTHER INSTRUCTIONS:  __________________________________________________________________________________________________________________________________________________________________________________________  °WHEN TO CALL YOUR DOCTOR: °1. Fever over 101.0 °2. Inability to urinate °3. Nausea and/or vomiting °4. Extreme swelling or bruising °5. Continued bleeding from incision. °6. Increased pain, redness, or drainage from the incision ° °The clinic staff is available to answer your questions during regular business hours.  Please don’t hesitate to call and ask to speak to one of the nurses for clinical concerns.  If you have a medical emergency, go to the nearest emergency room or call 911.  A surgeon from [facility] is always on call at the hospital ° ° °  [address] [place], [place], [place]  [address] ?  [address], [place], [place]   [address] [phone] ? [phone] ? FAX [phone] Web site:  [email or web address]    Post Anesthesia Home Care Instructions  Activity: Get plenty of rest for the remainder of the day. A responsible adult should stay with you for 24 hours following the procedure.  For the next 24 hours, DO NOT: -Drive a car -Paediatric nurse -Drink alcoholic beverages -Take any medication unless instructed by your physician -Make any legal decisions or sign important papers.  Meals: Start with liquid foods such as gelatin or soup. Progress to regular foods as tolerated. Avoid greasy, spicy, heavy foods. If nausea and/or vomiting occur, drink only clear liquids until the nausea and/or vomiting subsides. Call your physician if vomiting continues.  Special Instructions/Symptoms: Your throat may feel dry or sore from the anesthesia or the breathing tube placed in your throat during surgery. If this causes discomfort, gargle with warm salt water. The discomfort should disappear within 24 hours.  If you had a scopolamine patch placed behind your ear for the management of post- operative nausea and/or vomiting:  1. The medication in the patch is effective for 72 hours, after which it should be removed.  Wrap patch in a tissue and discard in the trash. Wash hands thoroughly with soap and water. 2. You may remove the patch earlier than 72 hours if you experience unpleasant side effects which may include dry mouth, dizziness or visual disturbances. 3. Avoid touching the patch. Wash your hands with soap and water after contact with the patch.    Call your surgeon if you experience:   1.  Fever over 101.0. 2.  Inability to urinate. 3.  Nausea and/or vomiting. 4.  Extreme swelling or bruising at the surgical site. 5.  Continued bleeding from the incision. 6.  Increased pain, redness or drainage from the incision. 7.  Problems related to your pain medication. 8. Any change in color, movement and/or sensation 9. Any problems and/or concernsCall your surgeon  if you experience:

## 2014-07-02 NOTE — Interval H&P Note (Signed)
History and Physical Interval Note:  07/02/2014 1:58 PM  Tony Owens  has presented today for surgery, with the diagnosis of Umbilical Hernia  The various methods of treatment have been discussed with the patient and family. After consideration of risks, benefits and other options for treatment, the patient has consented to  Procedure(s): REPAIR OF UMBILICAL HERNIA WITH MESH (N/A) INSERTION OF MESH (N/A) as a surgical intervention .  The patient's history has been reviewed, patient examined, no change in status, stable for surgery.  I have reviewed the patient's chart and labs.  Questions were answered to the patient's satisfaction.     Gregorio Worley A.

## 2014-07-02 NOTE — Transfer of Care (Signed)
Immediate Anesthesia Transfer of Care Note  Patient: Tony Owens  Procedure(s) Performed: Procedure(s): REPAIR OF UMBILICAL HERNIA WITH MESH (N/A) INSERTION OF MESH (N/A)  Patient Location: PACU  Anesthesia Type:General  Level of Consciousness: awake, alert  and oriented  Airway & Oxygen Therapy: Patient Spontanous Breathing and Patient connected to face mask oxygen  Post-op Assessment: Report given to RN and Post -op Vital signs reviewed and stable  Post vital signs: Reviewed and stable  Last Vitals:  Filed Vitals:   07/02/14 1252  BP: 117/71  Pulse: 67  Temp: 36.8 C  Resp: 20    Complications: No apparent anesthesia complications

## 2014-07-03 ENCOUNTER — Emergency Department (HOSPITAL_COMMUNITY)
Admission: EM | Admit: 2014-07-03 | Discharge: 2014-07-04 | Disposition: A | Discharge status: Home / Self Care | Payer: Worker's Compensation | Attending: Emergency Medicine | Admitting: Emergency Medicine

## 2014-07-03 ENCOUNTER — Emergency Department (HOSPITAL_COMMUNITY): Payer: Worker's Compensation

## 2014-07-03 ENCOUNTER — Encounter (HOSPITAL_BASED_OUTPATIENT_CLINIC_OR_DEPARTMENT_OTHER): Payer: Self-pay | Admitting: Surgery

## 2014-07-03 DIAGNOSIS — K5900 Constipation, unspecified: Secondary | ICD-10-CM

## 2014-07-03 DIAGNOSIS — R109 Unspecified abdominal pain: Secondary | ICD-10-CM | POA: Diagnosis present

## 2014-07-03 DIAGNOSIS — Z8719 Personal history of other diseases of the digestive system: Secondary | ICD-10-CM | POA: Diagnosis not present

## 2014-07-03 DIAGNOSIS — R011 Cardiac murmur, unspecified: Secondary | ICD-10-CM | POA: Diagnosis not present

## 2014-07-03 DIAGNOSIS — J45909 Unspecified asthma, uncomplicated: Secondary | ICD-10-CM | POA: Diagnosis not present

## 2014-07-03 DIAGNOSIS — G8918 Other acute postprocedural pain: Secondary | ICD-10-CM

## 2014-07-03 LAB — CBC WITH DIFFERENTIAL/PLATELET
Basophils Absolute: 0 10*3/uL (ref 0.0–0.1)
Basophils Relative: 0 % (ref 0–1)
EOS ABS: 0 10*3/uL (ref 0.0–0.7)
Eosinophils Relative: 0 % (ref 0–5)
HEMATOCRIT: 44 % (ref 39.0–52.0)
Hemoglobin: 15.3 g/dL (ref 13.0–17.0)
LYMPHS ABS: 2.1 10*3/uL (ref 0.7–4.0)
LYMPHS PCT: 16 % (ref 12–46)
MCH: 28.7 pg (ref 26.0–34.0)
MCHC: 34.8 g/dL (ref 30.0–36.0)
MCV: 82.4 fL (ref 78.0–100.0)
Monocytes Absolute: 1 10*3/uL (ref 0.1–1.0)
Monocytes Relative: 8 % (ref 3–12)
Neutro Abs: 9.7 10*3/uL — ABNORMAL HIGH (ref 1.7–7.7)
Neutrophils Relative %: 76 % (ref 43–77)
PLATELETS: 227 10*3/uL (ref 150–400)
RBC: 5.34 MIL/uL (ref 4.22–5.81)
RDW: 13.8 % (ref 11.5–15.5)
WBC: 12.8 10*3/uL — ABNORMAL HIGH (ref 4.0–10.5)

## 2014-07-03 MED ORDER — MORPHINE SULFATE 4 MG/ML IJ SOLN
4.0000 mg | Freq: Once | INTRAMUSCULAR | Status: AC
  Administered 2014-07-03: 4 mg via INTRAVENOUS
  Filled 2014-07-03: qty 1

## 2014-07-03 MED ORDER — ONDANSETRON HCL 4 MG/2ML IJ SOLN
4.0000 mg | Freq: Once | INTRAMUSCULAR | Status: AC
  Administered 2014-07-03: 4 mg via INTRAVENOUS
  Filled 2014-07-03: qty 2

## 2014-07-03 NOTE — ED Provider Notes (Signed)
CSN: [id number]     Arrival date & time 07/03/14  2210 History   First MD Initiated Contact with Patient 07/03/14 2226     Chief Complaint  Patient presents with  . Abdominal Pain     (Consider location/radiation/quality/duration/timing/severity/associated sxs/prior Treatment) HPI Patient is a 44 year old male with past medical history of umbilical hernia, recently repaired yesterday who presents the ER with complaint of mid abdominal "cramping" along with associated nausea, vomiting. Patient states his symptoms began this afternoon acutely. He reports multiple episodes of nonbilious vomiting with the last episode of vomiting having red streaks in it. Patient reports he has not had a bowel movement since the procedure yesterday. Patient denies any fever, chills, dysuria, chest pain, shortness of breath.  Past Medical History  Diagnosis Date  . Allergy   . Asthma   . Heart murmur   . Umbilical hernia   . Snores    Past Surgical History  Procedure Laterality Date  . Foot surgery Left   . Umbilical hernia repair N/A 07/02/2014    Procedure: REPAIR OF UMBILICAL HERNIA WITH MESH;  Surgeon: Erroll Luna, MD;  Location: [facility];  Service: General;  Laterality: N/A;  . Insertion of mesh N/A 07/02/2014    Procedure: INSERTION OF MESH;  Surgeon: Erroll Luna, MD;  Location: [facility];  Service: General;  Laterality: N/A;   No family history on file. History  Substance Use Topics  . Smoking status: Never Smoker   . Smokeless tobacco: Not on file  . Alcohol Use: No    Review of Systems  Constitutional: Negative for fever.  HENT: Negative for trouble swallowing.   Eyes: Negative for visual disturbance.  Respiratory: Negative for shortness of breath.   Cardiovascular: Negative for chest pain.  Gastrointestinal: Positive for nausea, vomiting and abdominal pain.  Genitourinary: Negative for dysuria.  Musculoskeletal: Negative for neck pain.  Skin:  Negative for rash.  Neurological: Negative for dizziness, weakness and numbness.  Psychiatric/Behavioral: Negative.     Allergies  Review of patient's allergies indicates no known allergies.  Home Medications   Prior to Admission medications   Medication Sig Start Date End Date Taking? Authorizing Provider  fluticasone-salmeterol (ADVAIR HFA) 115-21 MCG/ACT inhaler Inhale 2 puffs into the lungs every morning.    Yes Historical Provider, MD  oxyCODONE-acetaminophen (ROXICET) 5-325 MG per tablet Take 1-2 tablets by mouth every 4 (four) hours as needed. Patient taking differently: Take 1-2 tablets by mouth every 4 (four) hours as needed for moderate pain.  07/02/14  Yes Erroll Luna, MD  ondansetron (ZOFRAN) 4 MG tablet Take 1 tablet (4 mg total) by mouth every 8 (eight) hours as needed for nausea or vomiting. 07/04/14   Dahlia Bailiff, PA-C  polyethylene glycol (MIRALAX / GLYCOLAX) packet Take 17 g by mouth daily. 07/04/14   Dahlia Bailiff, PA-C   BP 121/69 mmHg  Pulse 73  Temp(Src) 97.6 F (36.4 C) (Oral)  Resp 16  SpO2 97% Physical Exam  Constitutional: He is oriented to person, place, and time. He appears well-developed and well-nourished. No distress.  HENT:  Head: Normocephalic and atraumatic.  Mouth/Throat: Oropharynx is clear and moist. No oropharyngeal exudate.  Eyes: Right eye exhibits no discharge. Left eye exhibits no discharge. No scleral icterus.  Neck: Normal range of motion.  Cardiovascular: Normal rate, regular rhythm and normal heart sounds.   No murmur heard. Pulmonary/Chest: Effort normal and breath sounds normal. No respiratory distress.  Abdominal: Soft. There is no tenderness. There  is no rigidity, no guarding, no tenderness at McBurney's point and negative Murphy's sign.  Surgical incision noted at umbilicus. Incision is closed, Dermabond covering. There is mild erythema and mild tenderness. No obvious purulent discharge, edema, signs of infection or dehiscence.   Musculoskeletal: Normal range of motion. He exhibits no edema or tenderness.  Neurological: He is alert and oriented to person, place, and time. No cranial nerve deficit. Coordination normal.  Skin: Skin is warm and dry. No rash noted. He is not diaphoretic.  Psychiatric: He has a normal mood and affect.  Nursing note and vitals reviewed.   ED Course  Procedures (including critical care time) Labs Review Labs Reviewed  CBC WITH DIFFERENTIAL/PLATELET - Abnormal; Notable for the following:    WBC 12.8 (*)    Neutro Abs 9.7 (*)    All other components within normal limits  LIPASE, BLOOD - Abnormal; Notable for the following:    Lipase 21 (*)    All other components within normal limits  COMPREHENSIVE METABOLIC PANEL - Abnormal; Notable for the following:    Alkaline Phosphatase 32 (*)    All other components within normal limits  URINALYSIS, ROUTINE W REFLEX MICROSCOPIC (NOT AT [facility])    Imaging Review Dg Abd 2 Views  07/03/2014   CLINICAL DATA:  44 year old male with a history of abdominal pain.  EXAM: ABDOMEN - 2 VIEW  COMPARISON:  Chest x-ray 07/08/2010  FINDINGS: Formed stool within the right colon, including ascending colon and hepatic flexure. Gas within the length of the transverse colon and splenic flexure. Small formed stool within the rectum.  Central small bowel gas.  No abnormally distended small bowel or colon.  Unremarkable appearance of the liver silhouette. No unexpected calcifications. No unexpected radiopaque foreign body.  Lung base demonstrates appearance of enlarged heart.  IMPRESSION: Moderate stool burden, may reflect constipation. No evidence of obstruction.  The heart diameter appears enlarged from the comparison plain film. This may be projectional, though if there is concern for any prior medical heart history, dedicated plain film of the chest would be useful.  Signed,  Dulcy Fanny. Earleen Newport, DO  [facility]  [facility]    Electronically Signed   By: Corrie Mckusick D.O.   On: 07/03/2014 23:28     EKG Interpretation None      MDM   Final diagnoses:  Abdominal pain, acute  Post-operative pain  Constipation, unspecified constipation type    Patient with hernia repair yesterday, postoperative pains this afternoon. Pain also consistent with constipation, stool burden noted on the upright abdominal x-rays. Patient is nontoxic, nonseptic appearing, in no apparent distress.  Patient's pain and other symptoms adequately managed in emergency department.   Labs, imaging and vitals reviewed.  Patient does not meet the SIRS or Sepsis criteria.  On repeat exam patient does not have a surgical abdomin and there are no peritoneal signs.  No indication of appendicitis, bowel obstruction, bowel perforation, cholecystitis, diverticulitis.    Patient's pain is controlled here, patient afebrile, hemodynamically stable and in no acute distress. Labs unremarkable for acute pathology. Surgical site appears benign. No concern for acute abdomen. No concern for infectious process. No concern for sepsis or SIRS.  Patient encouraged to use miralax, pain medicine as prescribed, we'll discharge to home tonight. Patient given antiemetics as well. Patient strongly encouraged to follow-up with general surgery tomorrow. Return precautions discussed, patient verbalizes understanding and agreement of this plan.  BP 121/69 mmHg  Pulse 73  Temp(Src)  97.6 F (36.4 C) (Oral)  Resp 16  SpO2 97%  Signed,  Dahlia Bailiff, PA-C 1:38 AM  Patient discussed with Dr. Virgel Manifold, MD  Dahlia Bailiff, PA-C 07/04/14 0139  Virgel Manifold, MD 07/05/14 2250

## 2014-07-03 NOTE — ED Notes (Signed)
Per EMS, Patient had hernia repair yesterday. Patient last took percocet at 1730. Patient's pain increased to a 10/10 with one episode of vomiting that had blood present. Patient was given 100 mcg of Fentanyl during transport and decreased pain to a 3/10. Patient is alert and oriented x4 upon arrival. Vitals per EMS: 125/79, 66 HR.

## 2014-07-04 LAB — URINALYSIS, ROUTINE W REFLEX MICROSCOPIC
Bilirubin Urine: NEGATIVE
Glucose, UA: NEGATIVE mg/dL
Hgb urine dipstick: NEGATIVE
KETONES UR: NEGATIVE mg/dL
LEUKOCYTES UA: NEGATIVE
Nitrite: NEGATIVE
PROTEIN: NEGATIVE mg/dL
SPECIFIC GRAVITY, URINE: 1.025 (ref 1.005–1.030)
Urobilinogen, UA: 0.2 mg/dL (ref 0.0–1.0)
pH: 7 (ref 5.0–8.0)

## 2014-07-04 LAB — COMPREHENSIVE METABOLIC PANEL
ALK PHOS: 32 U/L — AB (ref 38–126)
ALT: 40 U/L (ref 17–63)
AST: 27 U/L (ref 15–41)
Albumin: 3.5 g/dL (ref 3.5–5.0)
Anion gap: 8 (ref 5–15)
BUN: 15 mg/dL (ref 6–20)
CO2: 25 mmol/L (ref 22–32)
Calcium: 9.1 mg/dL (ref 8.9–10.3)
Chloride: 103 mmol/L (ref 101–111)
Creatinine, Ser: 1.24 mg/dL (ref 0.61–1.24)
GFR calc Af Amer: >60 mL/min (ref >60)
GFR calc non Af Amer: >60 mL/min (ref >60)
Glucose, Bld: 99 mg/dL (ref 65–99)
Potassium: 4.2 mmol/L (ref 3.5–5.1)
Sodium: 136 mmol/L (ref 135–145)
TOTAL PROTEIN: 6.8 g/dL (ref 6.5–8.1)
Total Bilirubin: 0.5 mg/dL (ref 0.3–1.2)

## 2014-07-04 LAB — LIPASE, BLOOD: LIPASE: 21 U/L — AB (ref 22–51)

## 2014-07-04 MED ORDER — ONDANSETRON HCL 4 MG PO TABS: 4.0000 mg | ORAL_TABLET | Freq: Three times a day (TID) | ORAL | Status: AC | PRN

## 2014-07-04 MED ORDER — POLYETHYLENE GLYCOL 3350 17 G PO PACK
17.0000 g | PACK | Freq: Once | ORAL | Status: AC
  Administered 2014-07-04: 17 g via ORAL
  Filled 2014-07-04: qty 1

## 2014-07-04 MED ORDER — POLYETHYLENE GLYCOL 3350 17 G PO PACK: 17.0000 g | PACK | Freq: Every day | ORAL | Status: AC

## 2014-07-04 NOTE — ED Notes (Signed)
Pt verbalizes understanding of d/c instructions and denies any further needs at this time. 

## 2014-07-04 NOTE — Discharge Instructions (Signed)
Pain Relief Preoperatively and Postoperatively °Being a good patient does not mean being a silent one. If you have questions, problems, or concerns about the pain you may feel after surgery, let your caregiver know. Patients have the right to assessment and management of pain. The treatment of pain after surgery is important to speed up recovery and return to normal activities. Severe pain after surgery, and the fear or anxiety associated with that pain, may cause extreme discomfort that: °· Prevents sleep. °· Decreases the ability to breathe deeply and cough. This can cause pneumonia or other upper airway infections. °· Causes your heart to beat faster and your blood pressure to be higher. °· Increases the risk for constipation and bloating. °· Decreases the ability of wounds to heal. °· May result in depression, increased anxiety, and feelings of helplessness. °Relief of pain before surgery is also important because it will lessen the pain after surgery. Patients who receive both pain relief before and after surgery experience greater pain relief than those who only receive pain relief after surgery. Let your caregiver know if you are having uncontrolled pain. This is very important. Pain after surgery is more difficult to manage if it is permitted to become severe, so prompt and adequate treatment of acute pain is necessary. °PAIN CONTROL METHODS °Your caregivers follow policies and procedures about the management of patient pain. These guidelines should be explained to you before surgery. Plans for pain control after surgery must be mutually decided upon and instituted with your full understanding and agreement. Do not be afraid to ask questions regarding the care you are receiving. There are many different ways your caregivers will attempt to control your pain, including the following methods. °As needed pain control °· You may be given pain medicine either through your intravenous (IV) tube, or as a pill or  liquid you can swallow. You will need to let your caregiver know when you are having pain. Then, your caregiver will give you the pain medicine ordered for you. °· Your pain medicine may make you constipated. If constipation occurs, drink more liquids if you can. Your caregiver may have you take a mild laxative. °IV patient-controlled analgesia pump (PCA pump) °· You can get your pain medicine through the IV tube which goes into your vein. You are able to control the amount of pain medicine that you get. The pain medicine flows in through an IV tube and is controlled by a pump. This pump gives you a set amount of pain medicine when you push the button hooked up to it. Nobody should push this button but you or someone specifically assigned by you to do so. It is set up to keep you from accidentally giving yourself too much pain medicine. You will be able to start using your pain pump in the recovery room after your surgery. This method can be helpful for most types of surgery. °· If you are still having too much pain, tell your caregiver. Also, tell your caregiver if you are feeling too sleepy or nauseous. °Continuous epidural pain control °· A thin, soft tube (catheter) is put into your back. Pain medicine flows through the catheter to lessen pain in the part of your body where the surgery is done. Continuous epidural pain control may work best for you if you are having surgery on your chest, abdomen, hip area, or legs. The epidural catheter is usually put into your back just before surgery. The catheter is left in until you can eat and take medicine by mouth. In most cases,   this may take 2 to 3 days.  Giving pain medicine through the epidural catheter may help you heal faster because:  Your bowel gets back to normal faster.  You can get back to eating sooner.  You can be up and walking sooner. Medicine that numbs the area (local anesthetic)  You may receive an injection of pain medicine near where the  pain is (local infiltration).  You may receive an injection of pain medicine near the nerve that controls the sensation to a specific part of the body (peripheral nerve block).  Medicine may be put in the spine to block pain (spinal block). Opioids  Moderate to moderately severe acute pain after surgery may respond to opioids.Opioids are narcotic pain medicine. Opioids are often combined with non-narcotic medicines to improve pain relief, diminish the risk of side effects, and reduce the chance of addiction.  If you follow your caregiver's directions about taking opioids and you do not have a history of substance abuse, your risk of becoming addicted is exceptionally small.Opioids are given for short periods of time in careful doses to prevent addiction. Other methods of pain control include:  Steroids.  Physical therapy.  Heat and cold therapy.  Compression, such as wrapping an elastic bandage around the area of pain.  Massage. These various ways of controlling pain may be used together. Combining different methods of pain control is called multimodal analgesia. Using this approach has many benefits, including being able to eat, move around, and leave the hospital sooner. Document Released: 04/02/2002 Document Revised: 04/04/2011 Document Reviewed: 04/06/2010 [facility] Patient Information 2015 [place], [place]. This information is not intended to replace advice given to you by your health care provider. Make sure you discuss any questions you have with your health care provider.  Constipation Constipation is when a person has fewer than three bowel movements a week, has difficulty having a bowel movement, or has stools that are dry, hard, or larger than normal. As people grow older, constipation is more common. If you try to fix constipation with medicines that make you have a bowel movement (laxatives), the problem may get worse. Long-term laxative use may cause the muscles of the colon  to become weak. A low-fiber diet, not taking in enough fluids, and taking certain medicines may make constipation worse.  CAUSES   Certain medicines, such as antidepressants, pain medicine, iron supplements, antacids, and water pills.   Certain diseases, such as diabetes, irritable bowel syndrome (IBS), thyroid disease, or depression.   Not drinking enough water.   Not eating enough fiber-rich foods.   Stress or travel.   Lack of physical activity or exercise.   Ignoring the urge to have a bowel movement.   Using laxatives too much.  SIGNS AND SYMPTOMS   Having fewer than three bowel movements a week.   Straining to have a bowel movement.   Having stools that are hard, dry, or larger than normal.   Feeling full or bloated.   Pain in the lower abdomen.   Not feeling relief after having a bowel movement.  DIAGNOSIS  Your health care provider will take a medical history and perform a physical exam. Further testing may be done for severe constipation. Some tests may include:  A barium enema X-ray to examine your rectum, colon, and, sometimes, your small intestine.   A sigmoidoscopy to examine your lower colon.   A colonoscopy to examine your entire colon. TREATMENT  Treatment will depend on the severity of your constipation and what  is causing it. Some dietary treatments include drinking more fluids and eating more fiber-rich foods. Lifestyle treatments may include regular exercise. If these diet and lifestyle recommendations do not help, your health care provider may recommend taking over-the-counter laxative medicines to help you have bowel movements. Prescription medicines may be prescribed if over-the-counter medicines do not work.  HOME CARE INSTRUCTIONS   Eat foods that have a lot of fiber, such as fruits, vegetables, whole grains, and beans.  Limit foods high in fat and processed sugars, such as french fries, hamburgers, cookies, candies, and soda.    A fiber supplement may be added to your diet if you cannot get enough fiber from foods.   Drink enough fluids to keep your urine clear or pale yellow.   Exercise regularly or as directed by your health care provider.   Go to the restroom when you have the urge to go. Do not hold it.   Only take over-the-counter or prescription medicines as directed by your health care provider. Do not take other medicines for constipation without talking to your health care provider first.  [place] IF:   You have bright red blood in your stool.   Your constipation lasts for more than 4 days or gets worse.   You have abdominal or rectal pain.   You have thin, pencil-like stools.   You have unexplained weight loss. MAKE SURE YOU:   Understand these instructions.  Will watch your condition.  Will get help right away if you are not doing well or get worse. Document Released: 10/09/2003 Document Revised: 01/15/2013 Document Reviewed: 10/22/2012 [facility] Patient Information 2015 [place], [place]. This information is not intended to replace advice given to you by your health care provider. Make sure you discuss any questions you have with your health care provider.

## 2016-07-20 IMAGING — DX DG ABDOMEN 2V
2 series · 2 of 2 positions shown · non-contrast
Comparison: Chest x-ray 07/08/2010

CLINICAL DATA: 44-year-old male with a history of abdominal pain.

EXAM:
ABDOMEN - 2 VIEW

[abdomen erect]
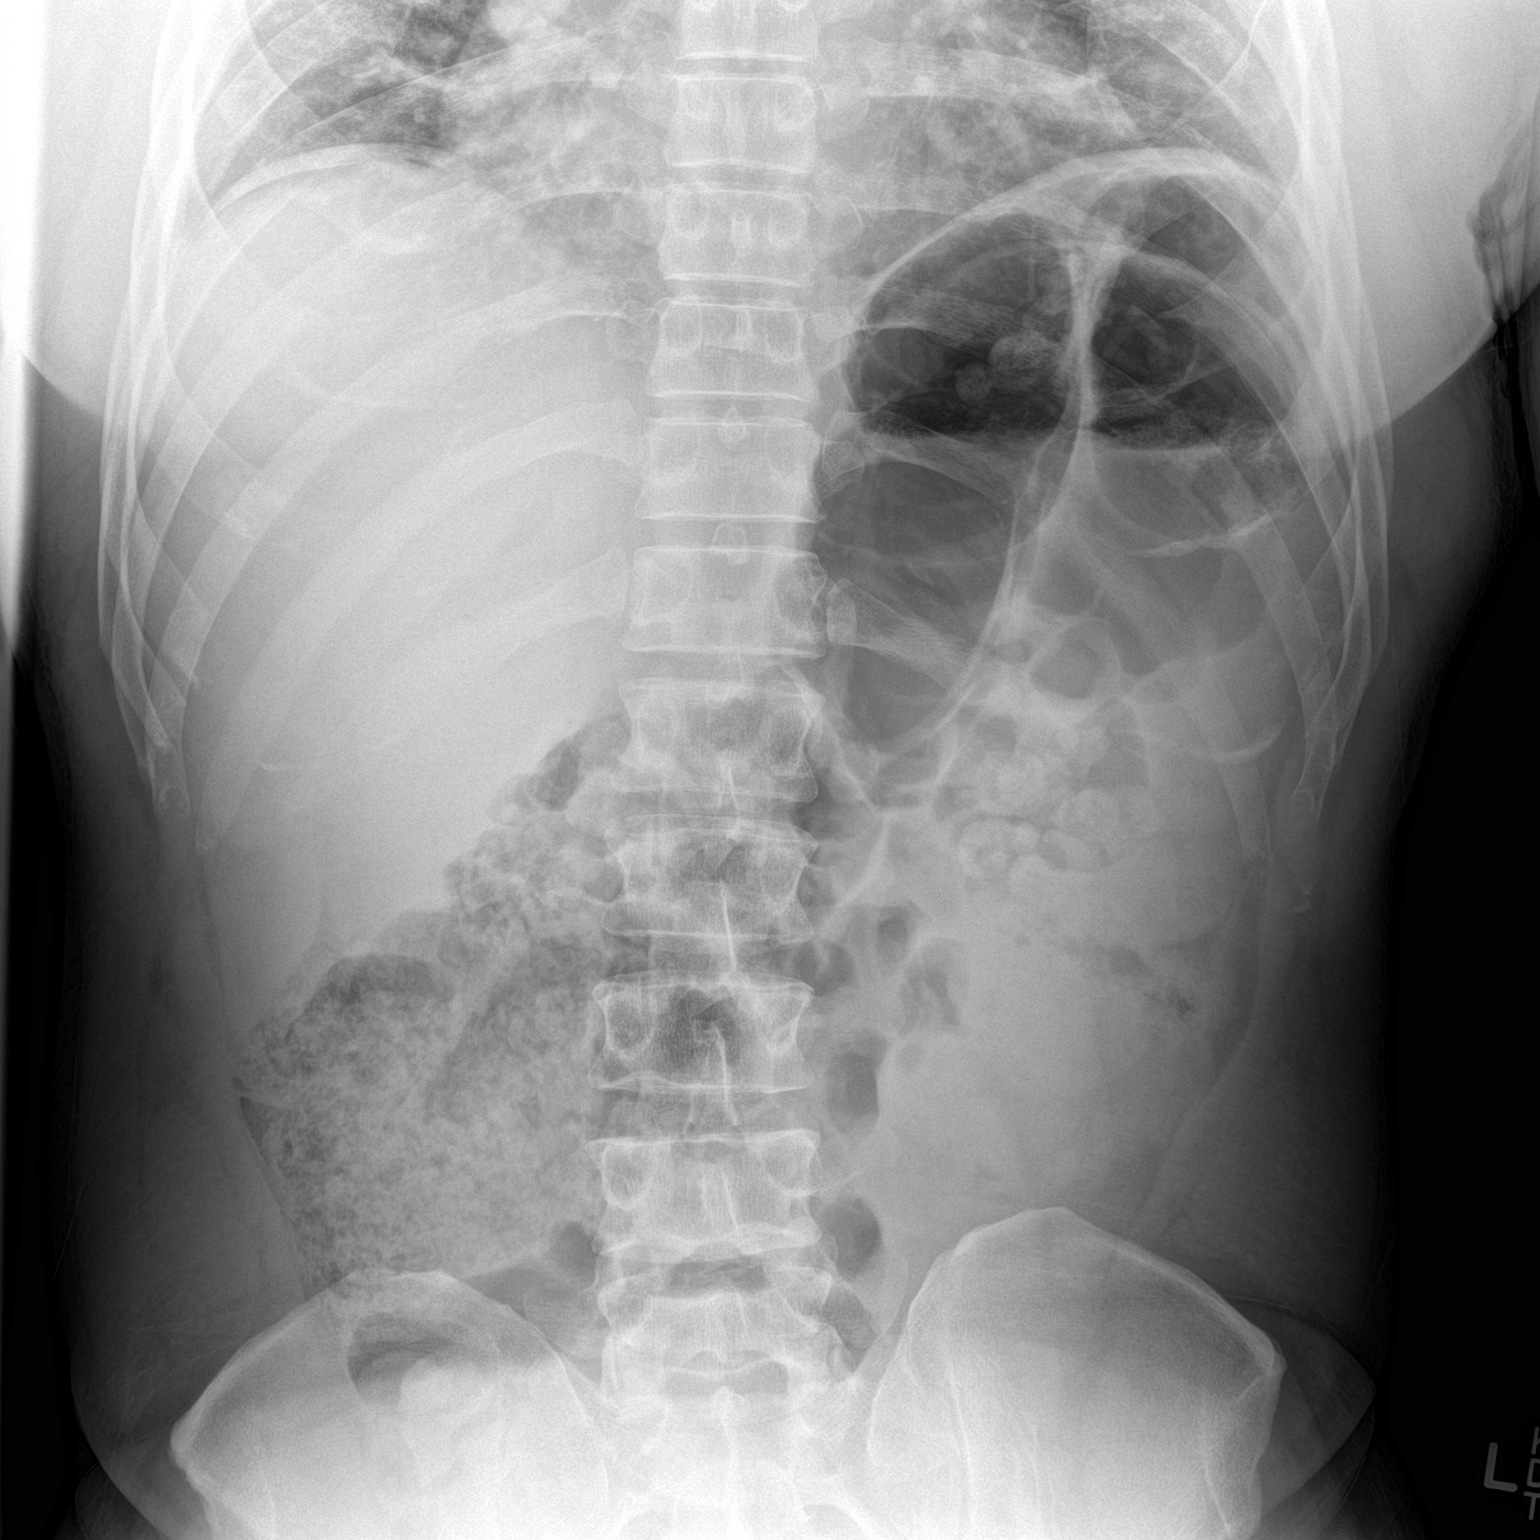

[abdomen supine]
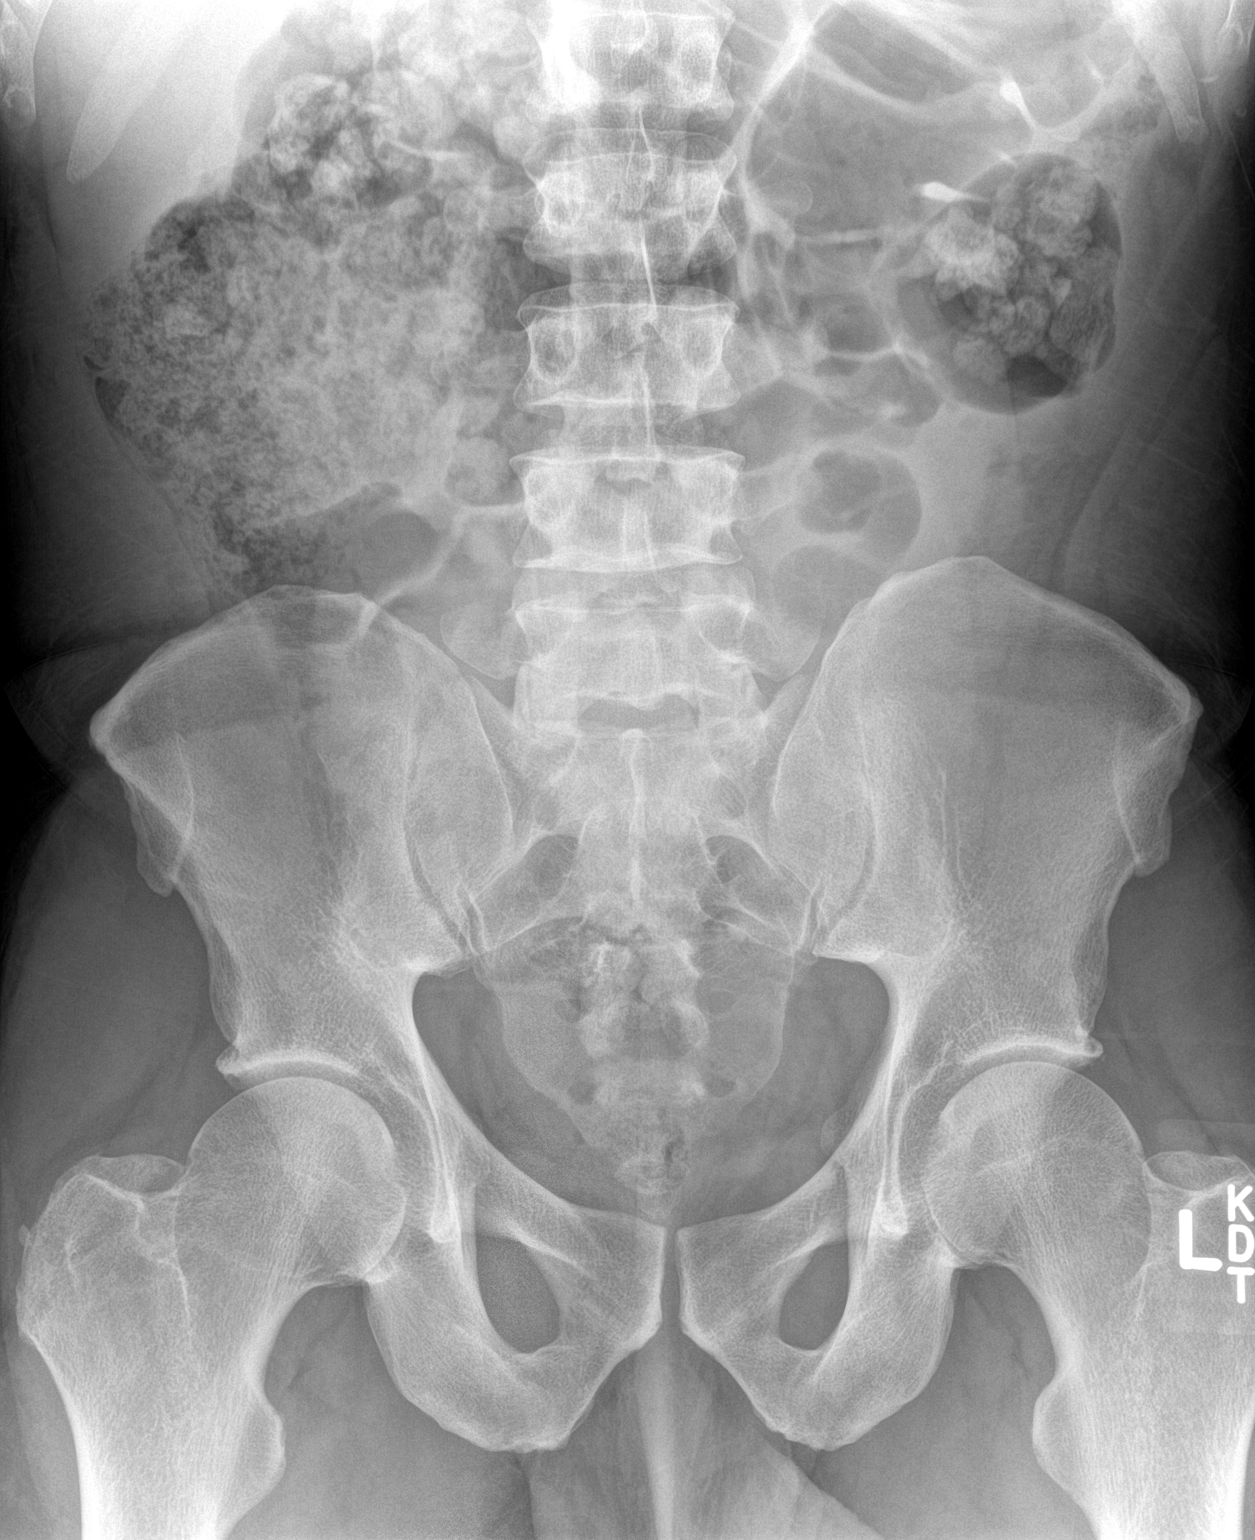

[2 of 2 positions shown; findings below may reference images not displayed]

FINDINGS: Formed stool within the right colon, including ascending colon and
hepatic flexure. Gas within the length of the transverse colon and
splenic flexure. Small formed stool within the rectum.

Central small bowel gas.

No abnormally distended small bowel or colon.

Unremarkable appearance of the liver silhouette. No unexpected
calcifications. No unexpected radiopaque foreign body.

Lung base demonstrates appearance of enlarged heart.
IMPRESSION: Moderate stool burden, may reflect constipation. No evidence of
obstruction.

The heart diameter appears enlarged from the comparison plain film.
This may be projectional, though if there is concern for any prior
medical heart history, dedicated plain film of the chest would be
useful.

## 2017-01-10 ENCOUNTER — Ambulatory Visit
Admission: RE | Admit: 2017-01-10 | Discharge: 2017-01-10 | Disposition: A | Discharge status: Home / Self Care | Payer: BC Managed Care – PPO | Source: Ambulatory Visit | Attending: Physical Medicine and Rehabilitation | Admitting: Physical Medicine and Rehabilitation

## 2017-01-10 ENCOUNTER — Other Ambulatory Visit: Payer: Self-pay | Admitting: Physical Medicine and Rehabilitation

## 2017-01-10 DIAGNOSIS — G894 Chronic pain syndrome: Secondary | ICD-10-CM

## 2017-01-11 ENCOUNTER — Other Ambulatory Visit: Payer: Self-pay | Admitting: Physical Medicine and Rehabilitation

## 2017-01-11 DIAGNOSIS — M7711 Lateral epicondylitis, right elbow: Secondary | ICD-10-CM

## 2017-01-23 ENCOUNTER — Ambulatory Visit
Admission: RE | Admit: 2017-01-23 | Discharge: 2017-01-23 | Disposition: A | Discharge status: Home / Self Care | Payer: BC Managed Care – PPO | Source: Ambulatory Visit | Attending: Physical Medicine and Rehabilitation | Admitting: Physical Medicine and Rehabilitation

## 2017-01-23 ENCOUNTER — Other Ambulatory Visit: Payer: BC Managed Care – PPO

## 2017-01-23 DIAGNOSIS — M7711 Lateral epicondylitis, right elbow: Secondary | ICD-10-CM

## 2017-07-20 ENCOUNTER — Other Ambulatory Visit: Payer: Self-pay | Admitting: Physical Medicine and Rehabilitation

## 2017-07-20 ENCOUNTER — Ambulatory Visit
Admission: RE | Admit: 2017-07-20 | Discharge: 2017-07-20 | Disposition: A | Discharge status: Home / Self Care | Payer: BC Managed Care – PPO | Source: Ambulatory Visit | Attending: Physical Medicine and Rehabilitation | Admitting: Physical Medicine and Rehabilitation

## 2017-07-20 DIAGNOSIS — M25562 Pain in left knee: Principal | ICD-10-CM

## 2017-07-20 DIAGNOSIS — M25561 Pain in right knee: Secondary | ICD-10-CM

## 2019-01-28 IMAGING — CR DG LUMBAR SPINE COMPLETE 4+V
5 series · 5 of 5 positions shown · non-contrast
Comparison: None.

CLINICAL DATA: Low back pain

EXAM:
LUMBAR SPINE - COMPLETE 4+ VIEW

[w lumbar spine ap]
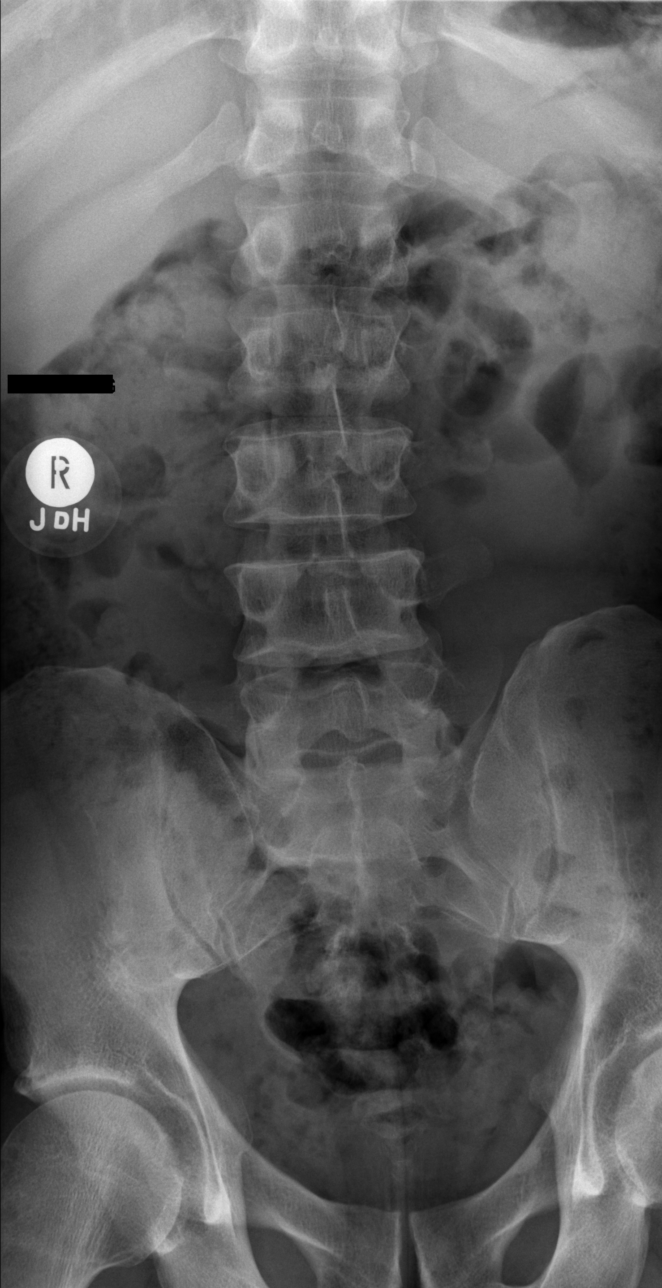

[w lumbar spine obl (1 of 2)]
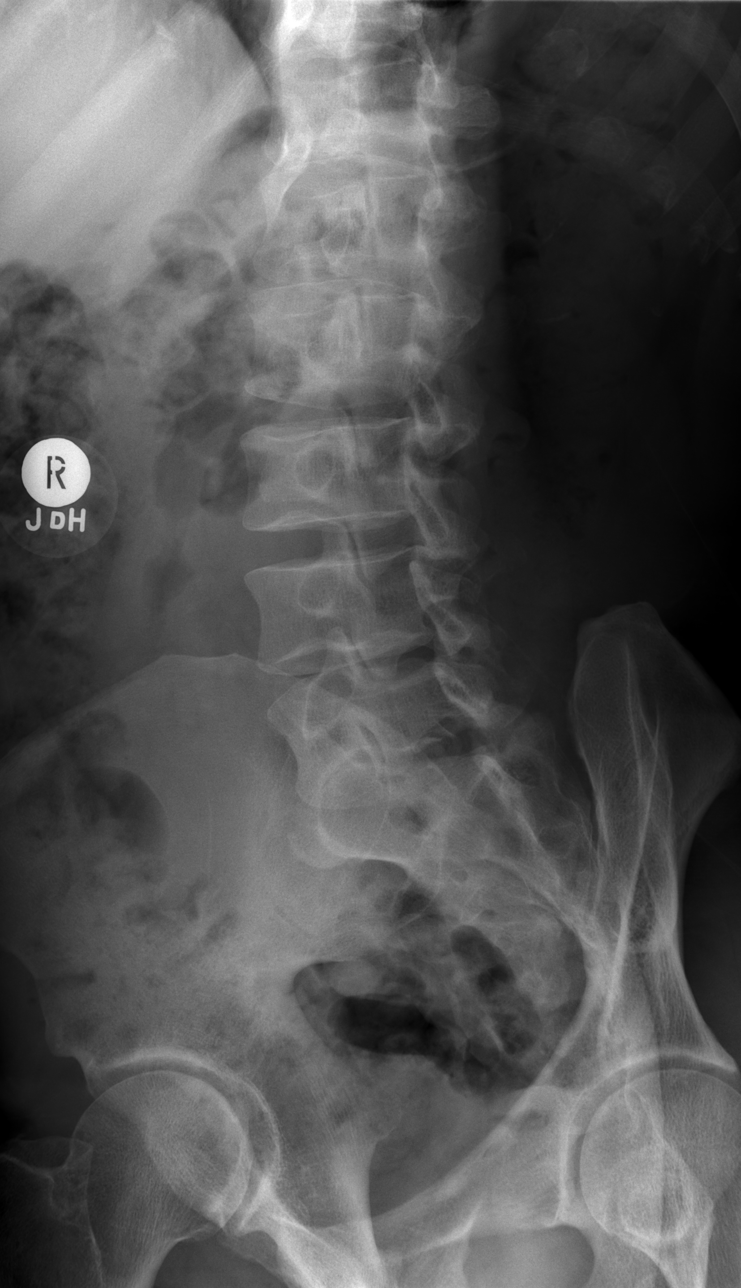

[w lumbar spine obl (2 of 2)]
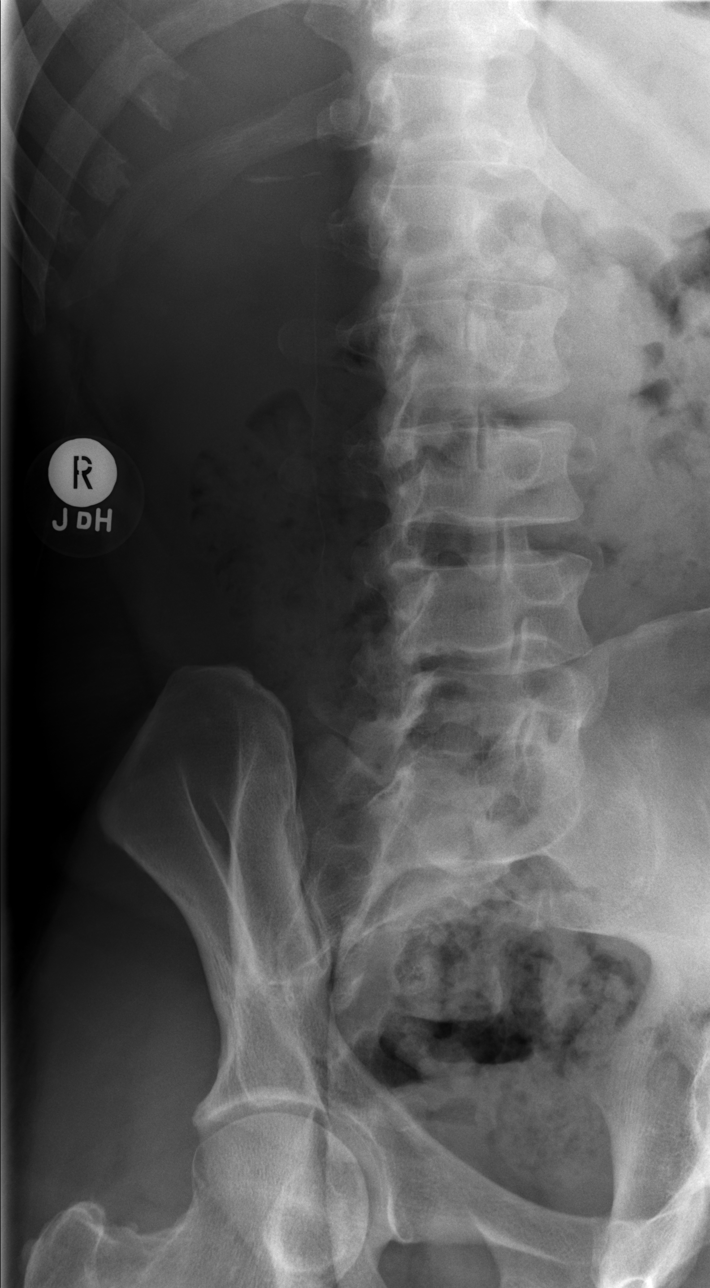

[w lumbar spine lat]
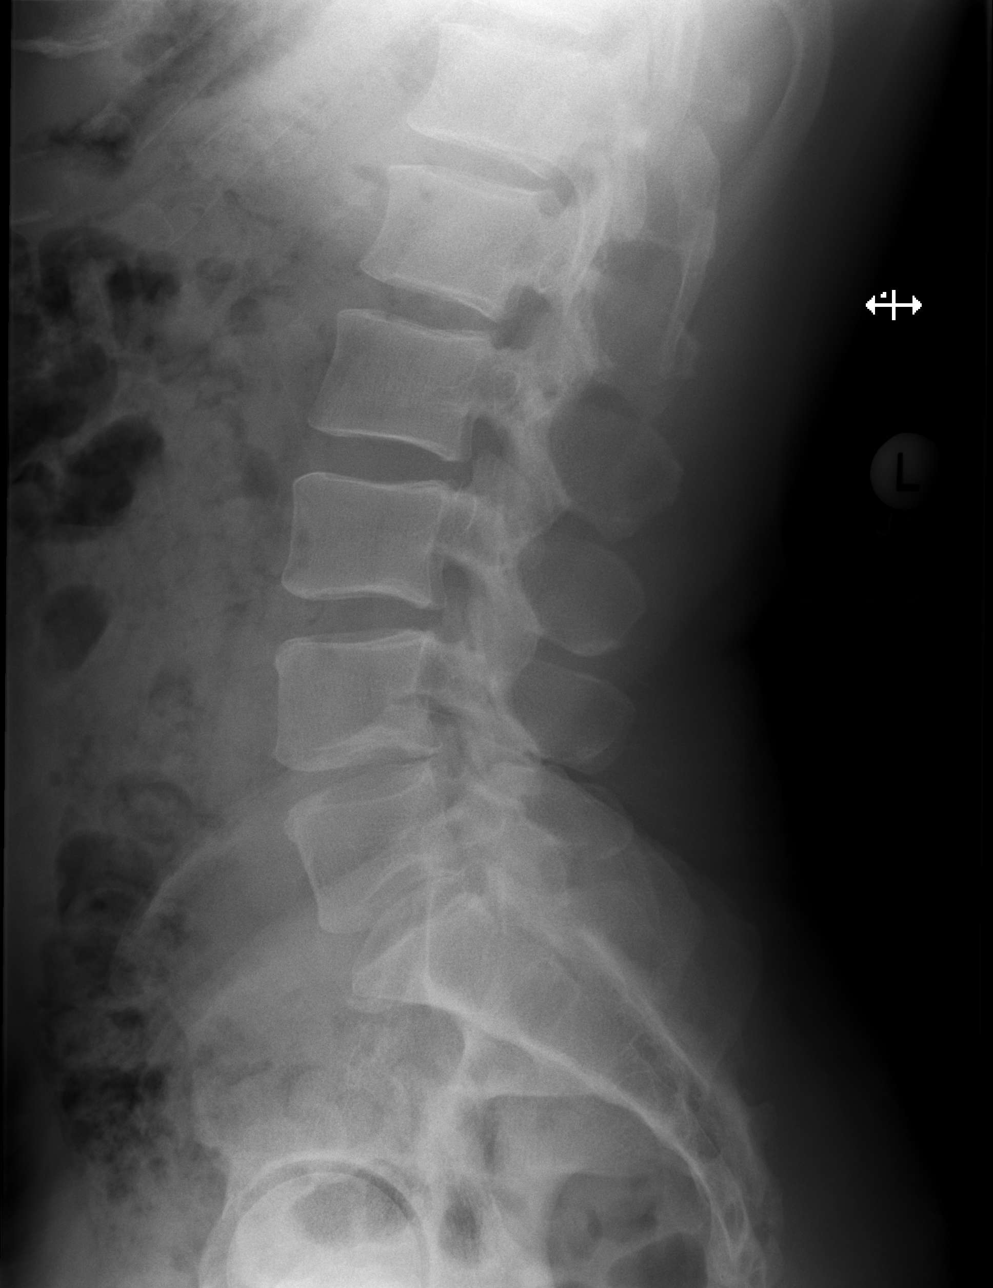

[w lumbar l-5 s-1 spot]
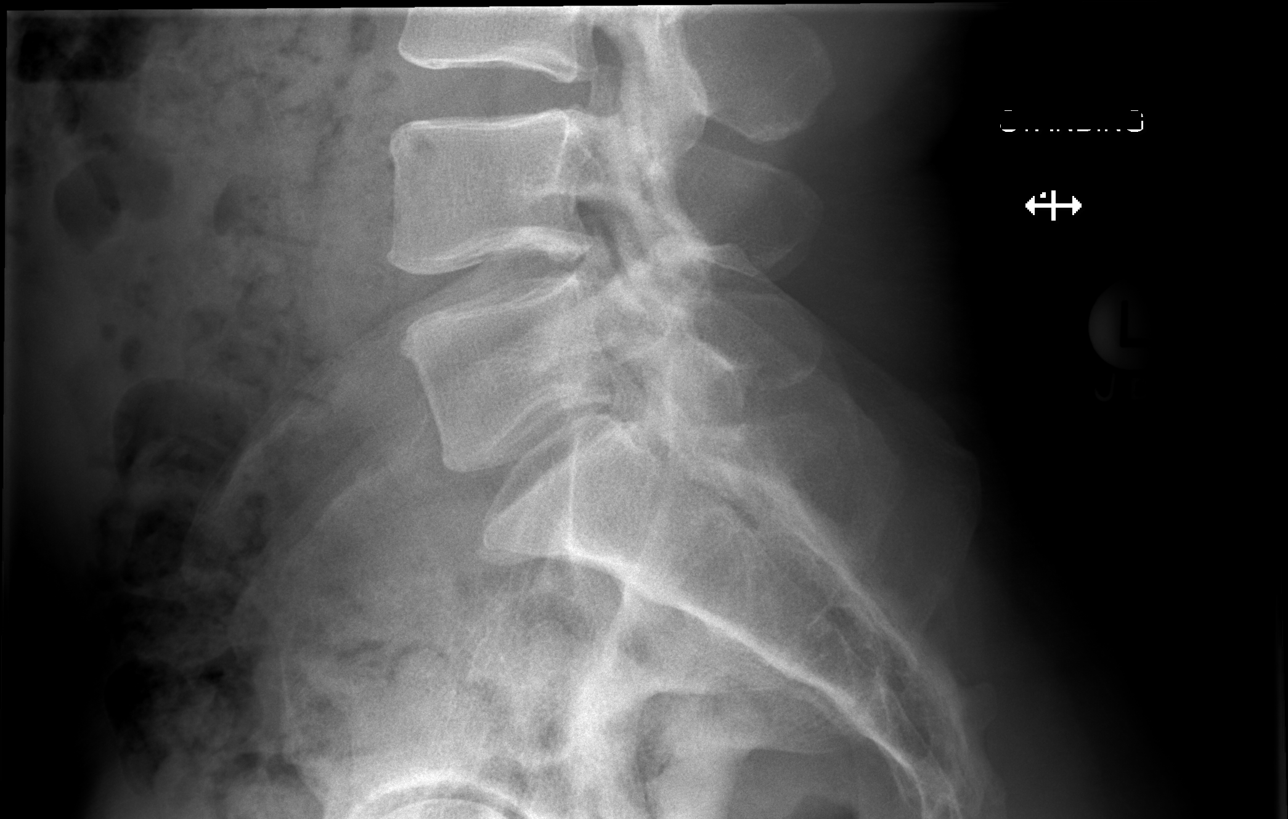

[5 of 5 positions shown; findings below may reference images not displayed]

FINDINGS: Lumbar alignment within normal limits. Vertebral body heights are
normal. Minimal degenerative changes at L4-L5 and L5-S1.
IMPRESSION: 1. No acute osseous abnormality
2. Minimal degenerative changes at L4-L5 and L5-S1

## 2019-01-28 IMAGING — CR DG KNEE 3 VIEWS*L*
3 series · 3 of 3 positions shown · non-contrast
Comparison: None.

CLINICAL DATA: Chronic pain syndrome

EXAM:
LEFT KNEE - 3 VIEW

[w knee ap left]
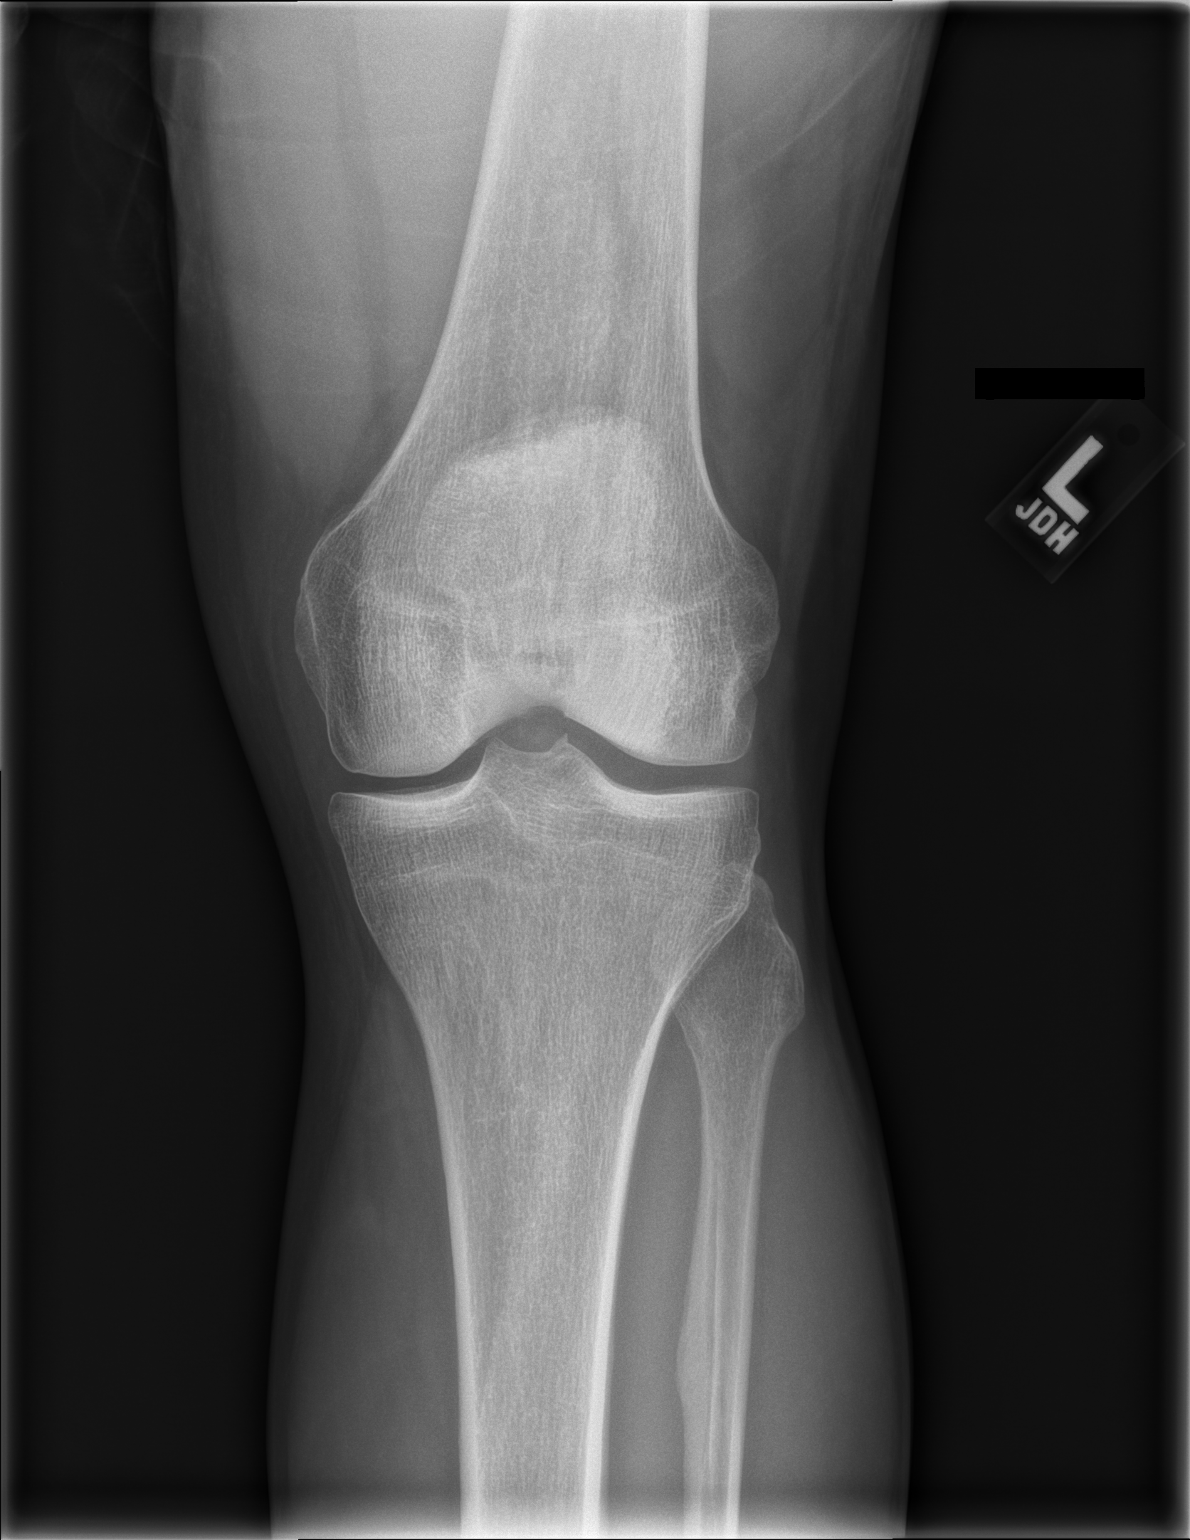

[w knee obl left]
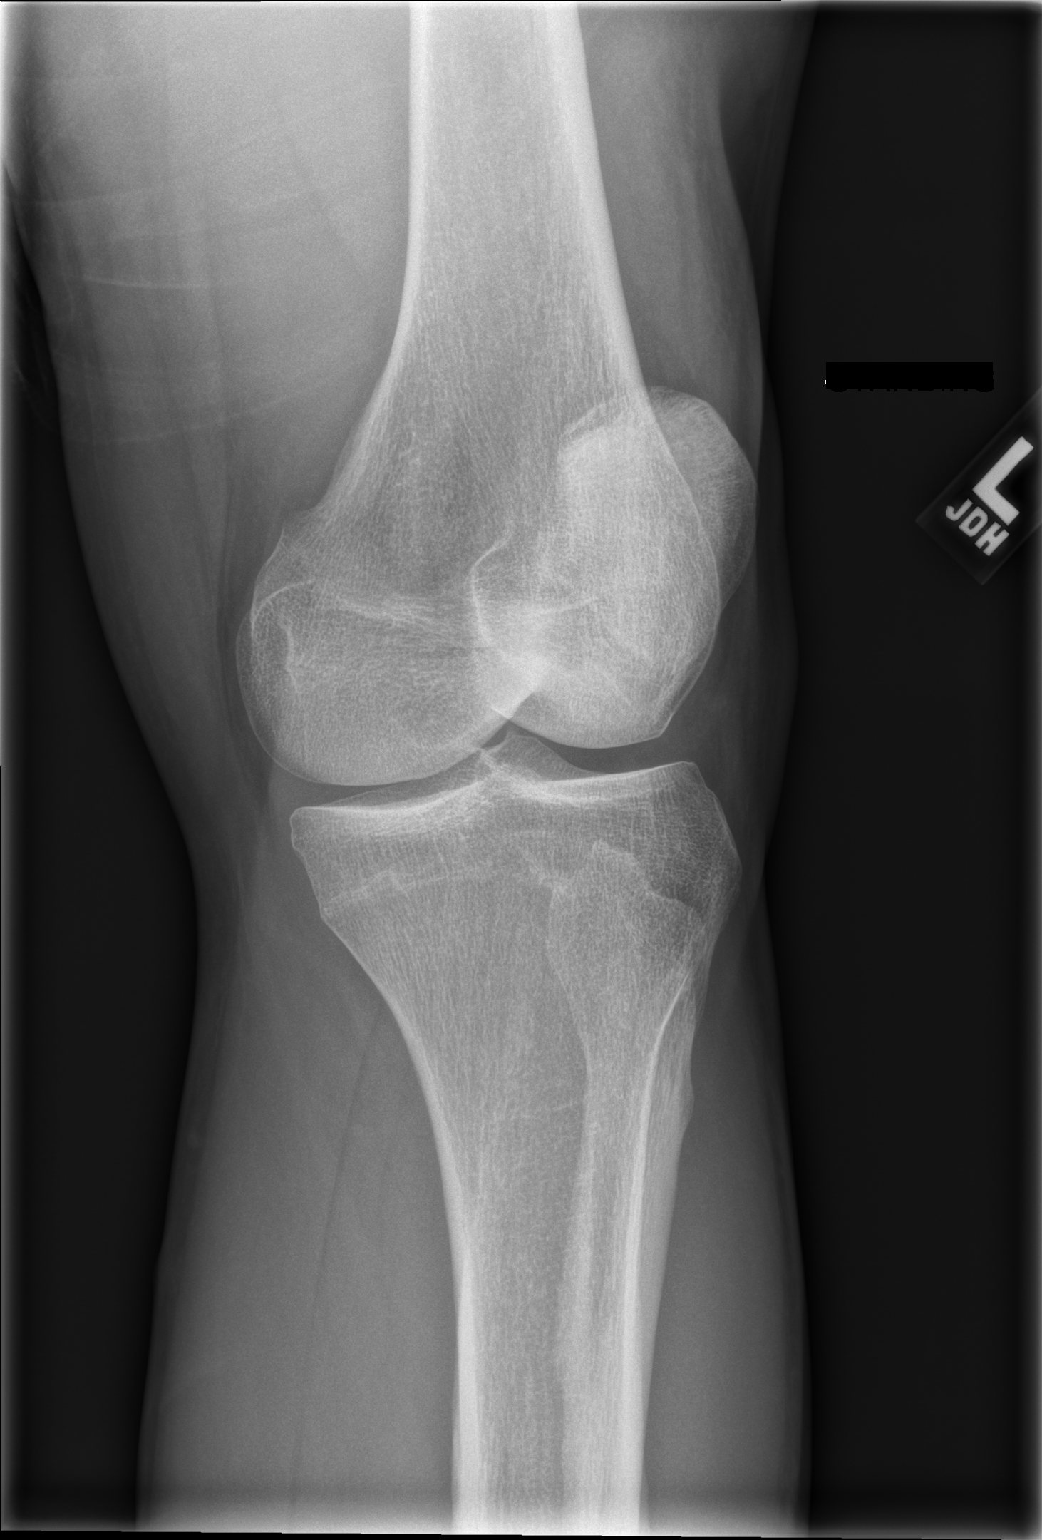

[w knee lat left]
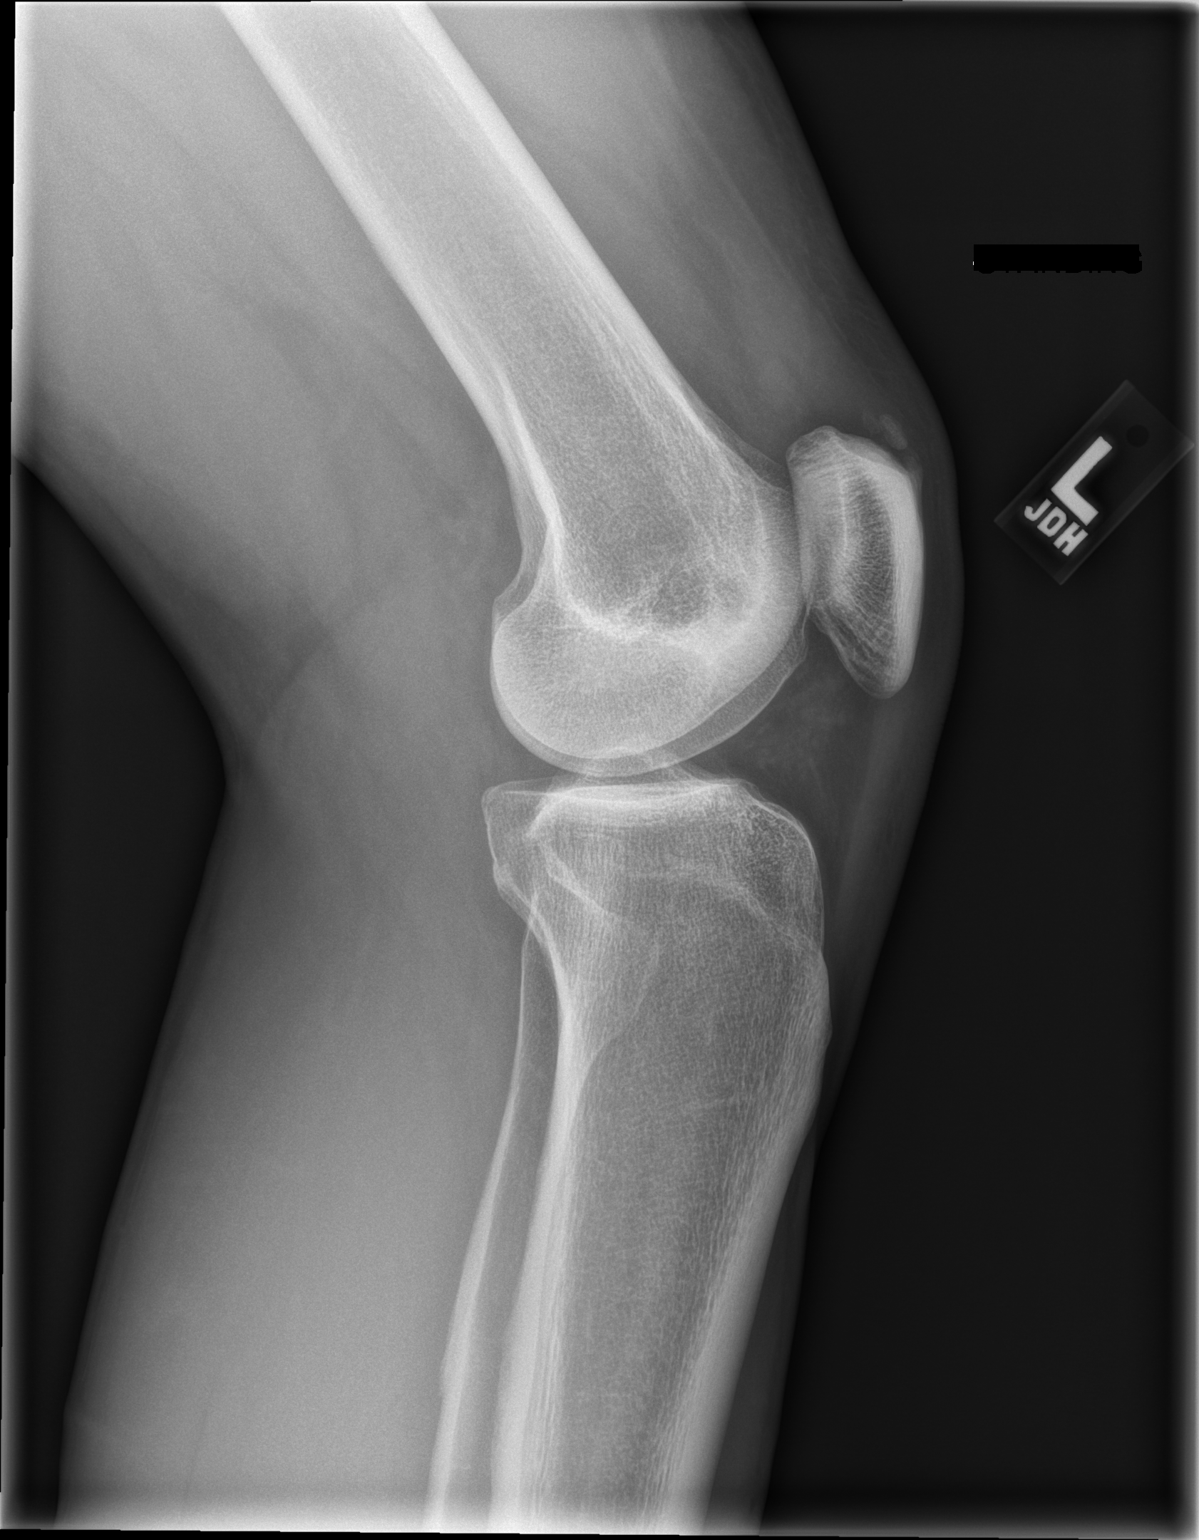

[3 of 3 positions shown; findings below may reference images not displayed]

FINDINGS: No fracture or malalignment. Joint spaces are maintained. No large
knee effusion.
IMPRESSION: Negative.

## 2019-01-28 IMAGING — CR DG KNEE 3 VIEWS*R*
3 series · 3 of 3 positions shown · non-contrast
Comparison: None.

CLINICAL DATA: Knee pain

EXAM:
RIGHT KNEE - 3 VIEW

[w knee ap right]
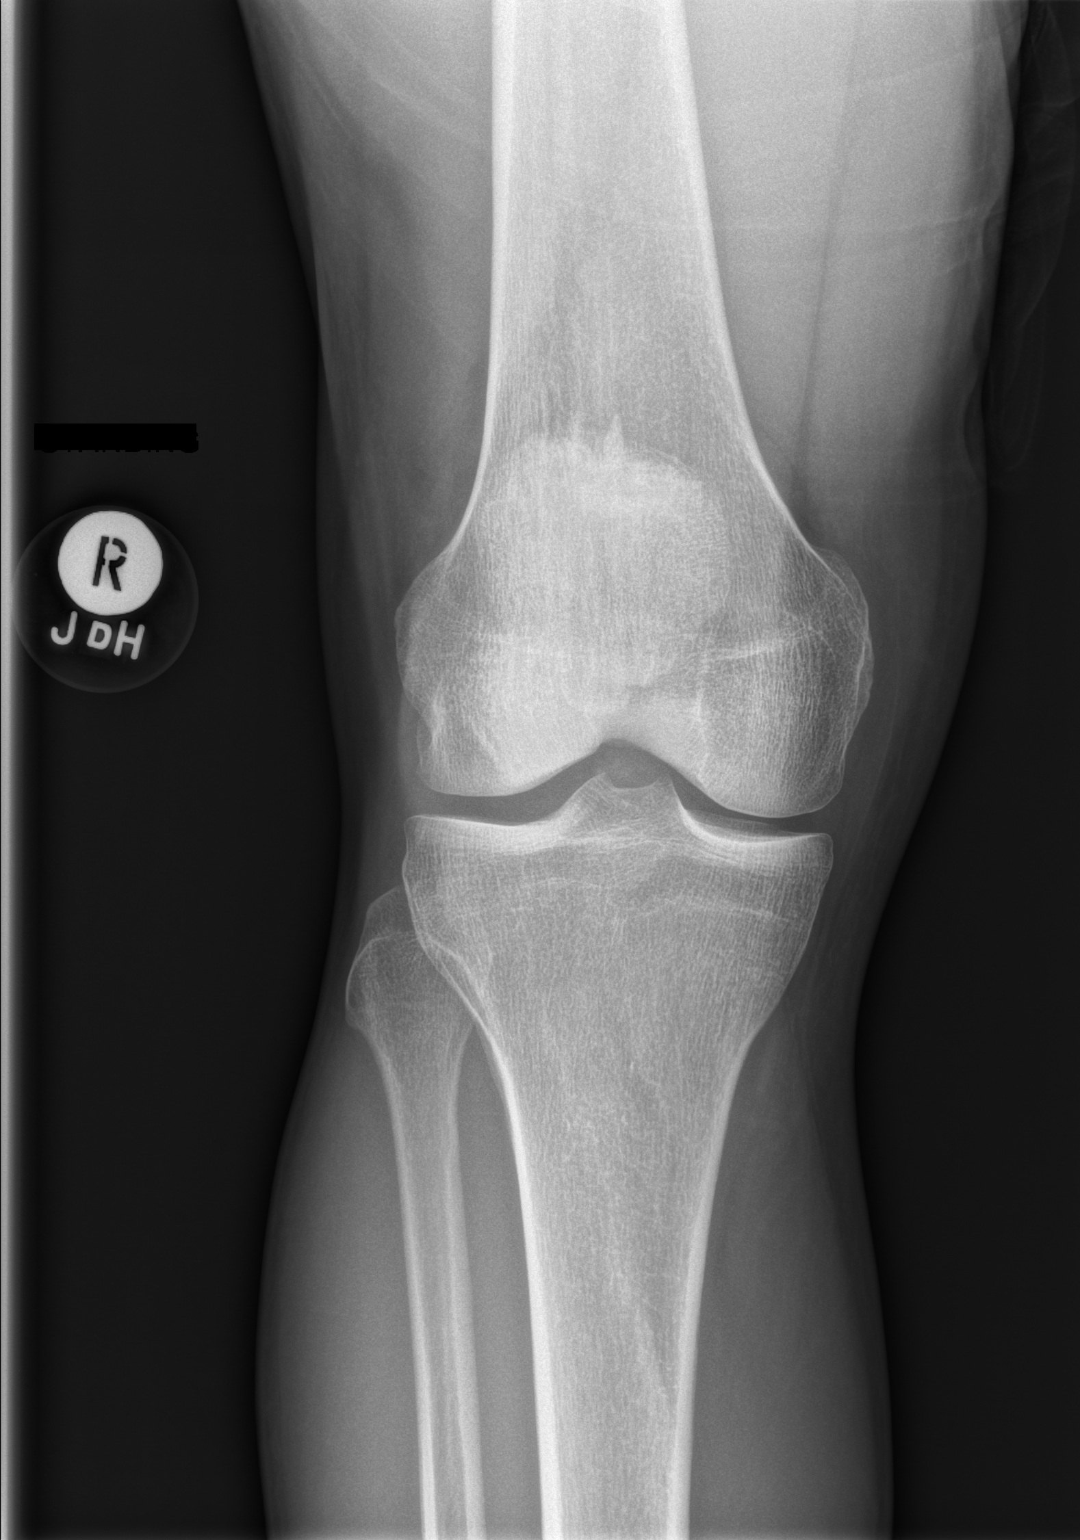

[w knee obl right]
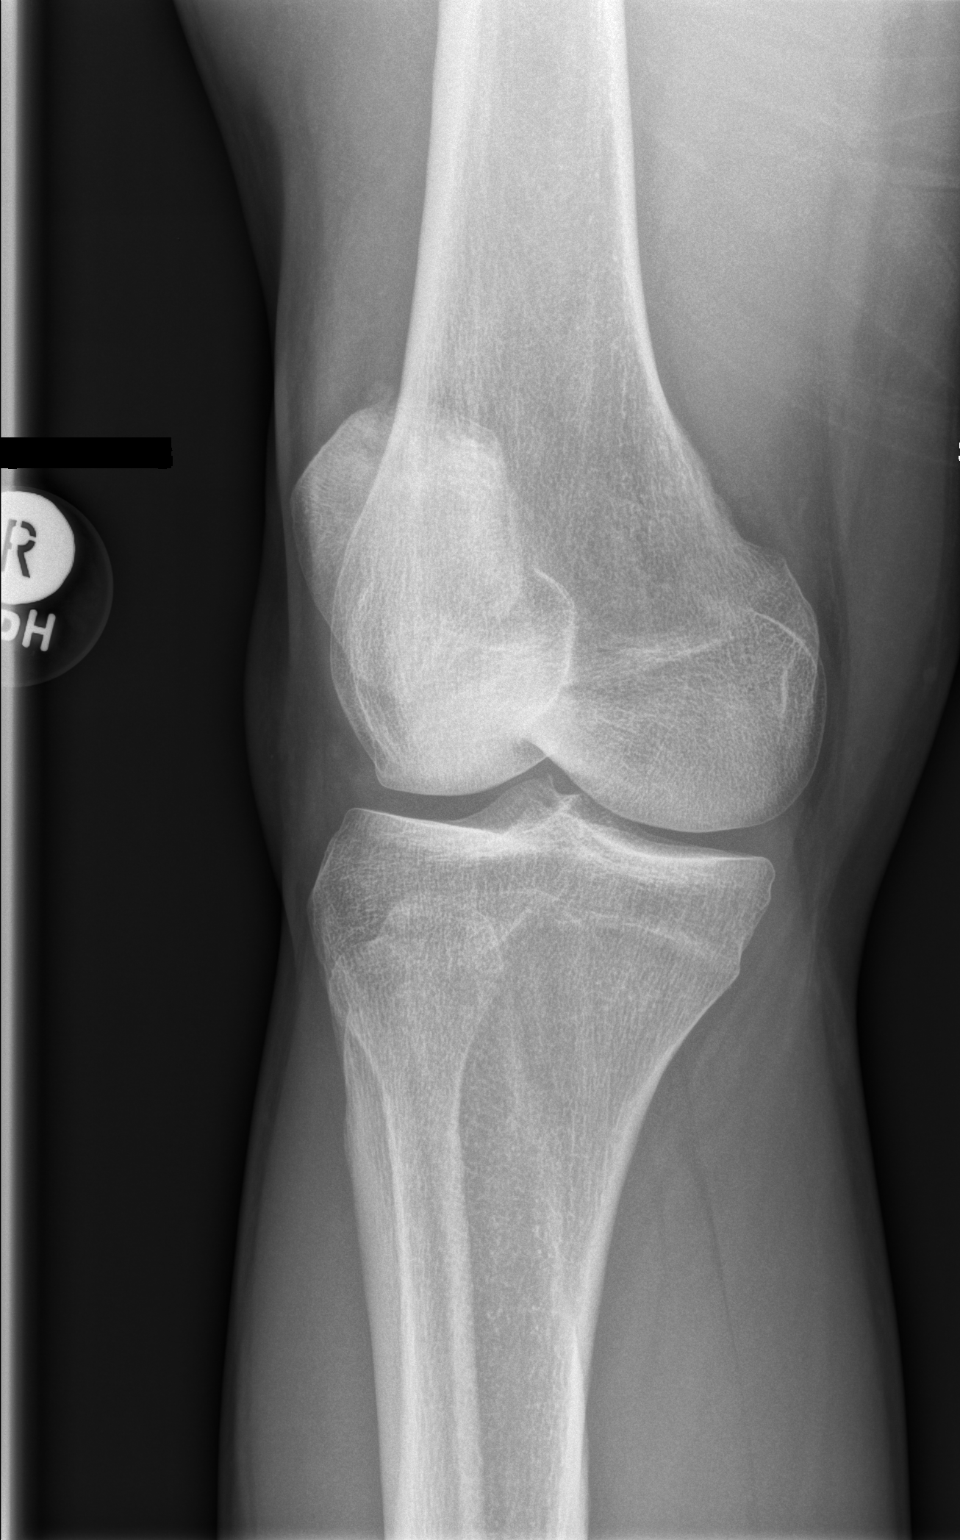

[w knee lat right]
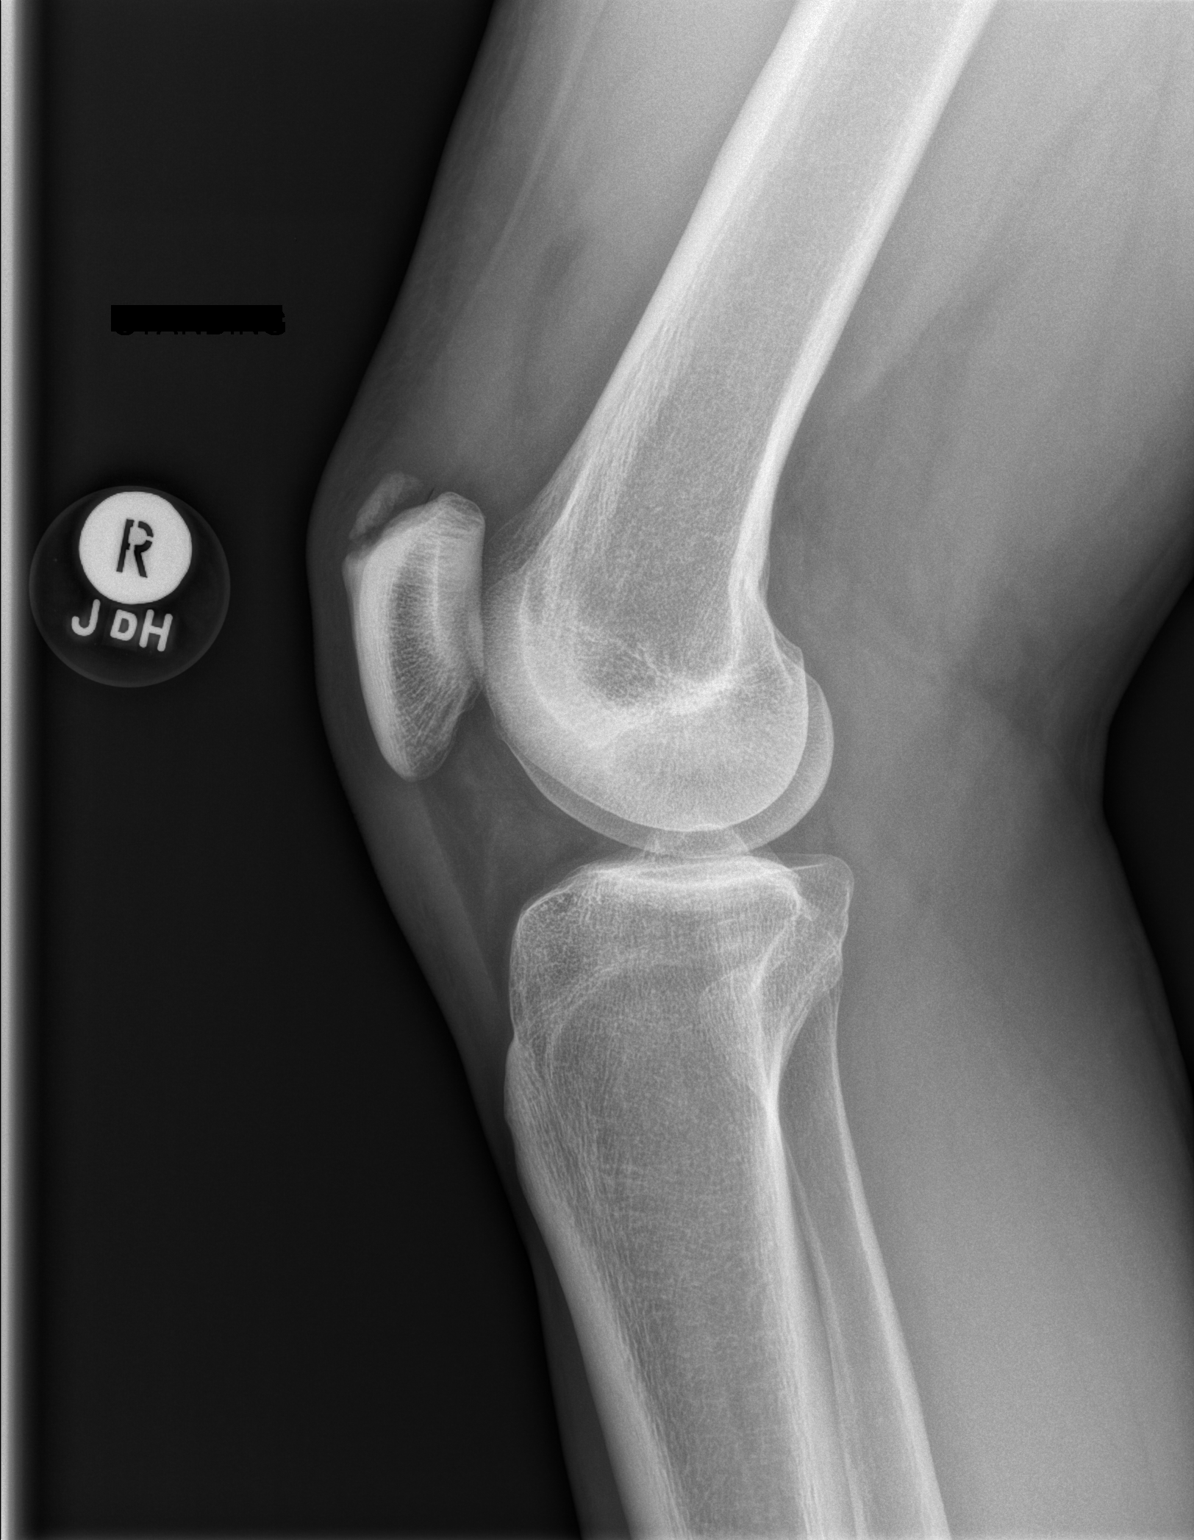

[3 of 3 positions shown; findings below may reference images not displayed]

FINDINGS: No fracture or malalignment is seen. Prominent calcification at the
quadriceps insertion into the patella. Joint spaces are maintained.
Possible small knee effusion
IMPRESSION: 1. No acute osseous abnormality
2. Possible small knee effusion

## 2020-12-17 ENCOUNTER — Encounter (HOSPITAL_BASED_OUTPATIENT_CLINIC_OR_DEPARTMENT_OTHER): Payer: Self-pay | Admitting: Emergency Medicine

## 2020-12-17 ENCOUNTER — Other Ambulatory Visit: Payer: Self-pay

## 2020-12-17 ENCOUNTER — Emergency Department (HOSPITAL_BASED_OUTPATIENT_CLINIC_OR_DEPARTMENT_OTHER): Payer: BC Managed Care – PPO

## 2020-12-17 ENCOUNTER — Emergency Department (HOSPITAL_BASED_OUTPATIENT_CLINIC_OR_DEPARTMENT_OTHER)
Admission: EM | Admit: 2020-12-17 | Discharge: 2020-12-17 | Disposition: A | Discharge status: Home / Self Care | Payer: BC Managed Care – PPO | Attending: Emergency Medicine | Admitting: Emergency Medicine

## 2020-12-17 DIAGNOSIS — M549 Dorsalgia, unspecified: Secondary | ICD-10-CM | POA: Diagnosis not present

## 2020-12-17 DIAGNOSIS — N50811 Right testicular pain: Secondary | ICD-10-CM | POA: Diagnosis present

## 2020-12-17 DIAGNOSIS — N50819 Testicular pain, unspecified: Secondary | ICD-10-CM

## 2020-12-17 DIAGNOSIS — N433 Hydrocele, unspecified: Secondary | ICD-10-CM | POA: Diagnosis not present

## 2020-12-17 DIAGNOSIS — N451 Epididymitis: Secondary | ICD-10-CM | POA: Diagnosis not present

## 2020-12-17 DIAGNOSIS — J45909 Unspecified asthma, uncomplicated: Secondary | ICD-10-CM | POA: Insufficient documentation

## 2020-12-17 LAB — URINALYSIS, MICROSCOPIC (REFLEX)

## 2020-12-17 LAB — URINALYSIS, ROUTINE W REFLEX MICROSCOPIC
Bilirubin Urine: NEGATIVE
Glucose, UA: NEGATIVE mg/dL
Hgb urine dipstick: NEGATIVE
Ketones, ur: NEGATIVE mg/dL
Nitrite: NEGATIVE
Protein, ur: 30 mg/dL — AB
Specific Gravity, Urine: 1.02 (ref 1.005–1.030)
pH: 7.5 (ref 5.0–8.0)

## 2020-12-17 MED ORDER — LIDOCAINE HCL (PF) 1 % IJ SOLN
1.0000 mL | Freq: Once | INTRAMUSCULAR | Status: AC
  Administered 2020-12-17: 1 mL
  Filled 2020-12-17: qty 5

## 2020-12-17 MED ORDER — IBUPROFEN 400 MG PO TABS
600.0000 mg | ORAL_TABLET | Freq: Once | ORAL | Status: AC
  Administered 2020-12-17: 600 mg via ORAL
  Filled 2020-12-17: qty 1

## 2020-12-17 MED ORDER — IBUPROFEN 600 MG PO TABS: 600.0000 mg | ORAL_TABLET | Freq: Four times a day (QID) | ORAL | 0 refills | Status: AC | PRN

## 2020-12-17 MED ORDER — DOXYCYCLINE HYCLATE 100 MG PO TABS: 100.0000 mg | ORAL_TABLET | Freq: Two times a day (BID) | ORAL | 0 refills | Status: DC

## 2020-12-17 MED ORDER — CEFTRIAXONE SODIUM 500 MG IJ SOLR
500.0000 mg | Freq: Once | INTRAMUSCULAR | Status: AC
  Administered 2020-12-17: 500 mg via INTRAMUSCULAR
  Filled 2020-12-17: qty 500

## 2020-12-17 MED ORDER — LEVOFLOXACIN 500 MG PO TABS: 500.0000 mg | ORAL_TABLET | Freq: Every day | ORAL | 0 refills | Status: AC

## 2020-12-17 MED ORDER — HYDROCODONE-ACETAMINOPHEN 5-325 MG PO TABS
1.0000 | ORAL_TABLET | Freq: Once | ORAL | Status: AC
  Administered 2020-12-17: 1 via ORAL
  Filled 2020-12-17: qty 1

## 2020-12-17 NOTE — ED Provider Notes (Signed)
[facility] Provider Note   CSN: [id number] Arrival date & time: 12/17/20  1015     History Chief Complaint  Patient presents with   Testicle Pain   Back Pain    Tony Owens is a 50 y.o. male.  This is a 50 y.o. male with significant medical history as below, including asthma, heart murmur who presents to the ED with complaint of pain to his right testicle.   Location: Right testicle Duration: 24 to 48 hours Onset: Gradual Timing: Constant Description: Sharp, aching pain to right testicle, radiation to his right inguinal area Severity: Mild Exacerbating/Alleviating Factors: Worsened with direct palpation, ambulation Associated Symptoms: Some mild back pain, arthralgias Pertinent Negatives: No fevers, chills, nausea, vomiting, change to bowel or bladder function.  No numbness or tingling, no recent suspicious oral intake.  No trauma.    The history is provided by the patient. No language interpreter was used.  Testicle Pain This is a new problem. The current episode started yesterday. The problem occurs constantly. The problem has been gradually worsening. Associated symptoms include abdominal pain. Pertinent negatives include no chest pain, no headaches and no shortness of breath.  Back Pain Associated symptoms: abdominal pain   Associated symptoms: no chest pain, no fever and no headaches       Past Medical History:  Diagnosis Date   Allergy    Asthma    Heart murmur    Snores    Umbilical hernia     Patient Active Problem List   Diagnosis Date Noted   Asthma, chronic 04/22/2013    Past Surgical History:  Procedure Laterality Date   FOOT SURGERY Left    HERNIA REPAIR     INSERTION OF MESH N/A 07/02/2014   Procedure: INSERTION OF MESH;  Surgeon: Erroll Luna, MD;  Location: [facility];  Service: General;  Laterality: N/A;   UMBILICAL HERNIA REPAIR N/A 07/02/2014   Procedure: REPAIR OF UMBILICAL HERNIA  WITH MESH;  Surgeon: Erroll Luna, MD;  Location: [facility];  Service: General;  Laterality: N/A;       History reviewed. No pertinent family history.  Social History   Tobacco Use   Smoking status: Never  Substance Use Topics   Alcohol use: No   Drug use: No    Home Medications Prior to Admission medications   Medication Sig Start Date End Date Taking? Authorizing Provider  fluticasone-salmeterol (ADVAIR HFA) 115-21 MCG/ACT inhaler Inhale 2 puffs into the lungs every morning.    Yes [provider]  ibuprofen (ADVIL) 600 MG tablet Take 1 tablet (600 mg total) by mouth every 6 (six) hours as needed. 12/17/20  Yes Wynona Dove A, DO  levofloxacin (LEVAQUIN) 500 MG tablet Take 1 tablet (500 mg total) by mouth daily. 12/17/20  Yes Wynona Dove A, DO  MONTELUKAST SODIUM PO Take by mouth.   Yes [provider]  pantoprazole (PROTONIX) 20 MG tablet Take 20 mg by mouth daily.   Yes [provider]  ondansetron (ZOFRAN) 4 MG tablet Take 1 tablet (4 mg total) by mouth every 8 (eight) hours as needed for nausea or vomiting. 07/04/14   Dahlia Bailiff, PA-C  oxyCODONE-acetaminophen (ROXICET) 5-325 MG per tablet Take 1-2 tablets by mouth every 4 (four) hours as needed. Patient taking differently: Take 1-2 tablets by mouth every 4 (four) hours as needed for moderate pain. 07/02/14   Cornett, [name], MD  polyethylene glycol (MIRALAX / GLYCOLAX) packet Take 17 g by  mouth daily. 07/04/14   Dahlia Bailiff, PA-C    Allergies    Patient has no known allergies.  Review of Systems   Review of Systems  Constitutional:  Negative for chills and fever.  HENT:  Negative for facial swelling and trouble swallowing.   Eyes:  Negative for photophobia and visual disturbance.  Respiratory:  Negative for cough and shortness of breath.   Cardiovascular:  Negative for chest pain and palpitations.  Gastrointestinal:  Positive for abdominal pain. Negative for nausea and vomiting.   Endocrine: Negative for polydipsia and polyuria.  Genitourinary:  Positive for testicular pain. Negative for difficulty urinating and hematuria.  Musculoskeletal:  Positive for arthralgias and back pain. Negative for gait problem and joint swelling.  Skin:  Negative for pallor and rash.  Neurological:  Negative for syncope and headaches.  Psychiatric/Behavioral:  Negative for agitation and confusion.    Physical Exam Updated Vital Signs BP 117/66   Pulse 79   Temp 98.7 F (37.1 C) (Oral)   Resp 16   Ht 5\' 10"  (1.778 m)   Wt 99.8 kg   SpO2 98%   BMI 31.57 kg/m   Physical Exam Vitals and nursing note reviewed. Exam conducted with a chaperone present.  Constitutional:      General: He is not in acute distress.    Appearance: He is well-developed.  HENT:     Head: Normocephalic and atraumatic.     Right Ear: External ear normal.     Left Ear: External ear normal.     Mouth/Throat:     Mouth: Mucous membranes are moist.  Eyes:     General: No scleral icterus. Cardiovascular:     Rate and Rhythm: Normal rate and regular rhythm.     Pulses: Normal pulses.     Heart sounds: Normal heart sounds.  Pulmonary:     Effort: Pulmonary effort is normal. No respiratory distress.     Breath sounds: Normal breath sounds.  Abdominal:     General: Abdomen is flat.     Palpations: Abdomen is soft.     Tenderness: There is no abdominal tenderness.     Hernia: There is no hernia in the left inguinal area or right inguinal area.  Genitourinary:    Penis: Normal.      Testes:        Right: Swelling present. Cremasteric reflex is present.         Left: Swelling not present. Cremasteric reflex is present.      Epididymis:     Right: Enlarged. Tenderness present.       Comments: Cremasteric reflexes present bilaterally.  No cellulitis.  No rashes or lesions.  No drainage from the urethral meatus Musculoskeletal:        General: Normal range of motion.     Cervical back: Normal range of  motion.     Right lower leg: No edema.     Left lower leg: No edema.  Skin:    General: Skin is warm and dry.     Capillary Refill: Capillary refill takes less than 2 seconds.  Neurological:     Mental Status: He is alert and oriented to person, place, and time.  Psychiatric:        Mood and Affect: Mood normal.        Behavior: Behavior normal.    ED Results / Procedures / Treatments   Labs (all labs ordered are listed, but only abnormal results are displayed) Labs Reviewed  URINALYSIS, ROUTINE W REFLEX MICROSCOPIC - Abnormal; Notable for the following components:      Result Value   Protein, ur 30 (*)    Leukocytes,Ua TRACE (*)    All other components within normal limits  URINALYSIS, MICROSCOPIC (REFLEX) - Abnormal; Notable for the following components:   Bacteria, UA RARE (*)    All other components within normal limits  URINE CULTURE  GC/CHLAMYDIA PROBE AMP () NOT AT [facility]    EKG None  Radiology US SCROTUM W/DOPPLER  Result Date: 12/17/2020 CLINICAL DATA:  Right testicular pain for 1 day EXAM: SCROTAL ULTRASOUND DOPPLER ULTRASOUND OF THE TESTICLES TECHNIQUE: Complete ultrasound examination of the testicles, epididymis, and other scrotal structures was performed. Color and spectral Doppler ultrasound were also utilized to evaluate blood flow to the testicles. COMPARISON:  None. FINDINGS: Right testicle Measurements: 3.1 x 1.6 x 2.2 cm. No mass or microlithiasis visualized. Left testicle Measurements: 3.1 x 1.5 x 1.8 cm. No mass or microlithiasis visualized. Right epididymis: Enlarged and heterogeneous with increased vascularity. Left epididymis: Normal in size and appearance. Small fluid collection near the epididymal tail measuring up to 1.2 cm may represent a somewhat loculated component of left hydrocele. Hydrocele:  Complex moderate right hydrocele.  Small left hydrocele. Varicocele:  None visualized. Pulsed Doppler interrogation of both testes demonstrates  normal low resistance arterial and venous waveforms bilaterally. IMPRESSION: 1. Negative for testicular torsion or intratesticular mass. 2. Findings suggestive of acute right-sided epididymitis. 3. Moderate complex right hydrocele, likely reactive. 4. Small left hydrocele, which appears partially loculated. Electronically Signed   By: Davina Poke D.O.   On: 12/17/2020 11:19    Procedures Procedures   Medications Ordered in ED Medications  HYDROcodone-acetaminophen (NORCO/VICODIN) 5-325 MG per tablet 1 tablet (1 tablet Oral Given 12/17/20 1140)  ibuprofen (ADVIL) tablet 600 mg (600 mg Oral Given 12/17/20 1140)  cefTRIAXone (ROCEPHIN) injection 500 mg (500 mg Intramuscular Given 12/17/20 1227)  lidocaine (PF) (XYLOCAINE) 1 % injection 1 mL (1 mL Other Given 12/17/20 1227)    ED Course  I have reviewed the triage vital signs and the nursing notes.  Pertinent labs & imaging results that were available during my care of the patient were reviewed by me and considered in my medical decision making (see chart for details).    MDM Rules/Calculators/A&P                           CC: Testicle swelling  This patient complains of above; this involves an extensive number of treatment options and is a complaint that carries with it a high risk of complications and morbidity. Vital signs were reviewed. Serious etiologies considered.  Record review:   Previous records obtained and reviewed   Work up as above, notable for:  Labs & imaging results that were available during my care of the patient were reviewed by me and considered in my medical decision making.   I ordered imaging studies which included scrotal ultrasound and I independently visualized and interpreted imaging which showed epididymitis, hydrocele  Urinalysis reviewed, trace leukocytes, rare bacteria, WBC.  Urine culture sent.  Management: Patient given analgesics.  Discussed treatment of epididymitis, infection noted in  urine. No recent GU surgery, no history of BPH,  He is unsure about STI exposure but feels it is possible. Some possible scant urethral discharge earlier. He is agreeable to starting empiric STI treatment. Will give rocephin x1 in ED and levaquin for home as  STI is not confirmed and would like to cover for enteric pathogens as well. Advised supportive undergarments and ice as needed at home, nsaids/apap prn for pain.  The patient improved significantly and was discharged in stable condition. Detailed discussions were had with the patient regarding current findings, and need for close f/u with PCP or on call doctor. The patient has been instructed to return immediately if the symptoms worsen in any way for re-evaluation. Patient verbalized understanding and is in agreement with current care plan. All questions answered prior to discharge.          This chart was dictated using voice recognition software.  Despite best efforts to proofread,  errors can occur which can change the documentation meaning.  Final Clinical Impression(s) / ED Diagnoses Final diagnoses:  Epididymitis  Hydrocele in adult    Rx / DC Orders ED Discharge Orders          Ordered    doxycycline (VIBRA-TABS) 100 MG tablet  2 times daily,   Status:  Discontinued        12/17/20 1156    ibuprofen (ADVIL) 600 MG tablet  Every 6 hours PRN        12/17/20 1213    levofloxacin (LEVAQUIN) 500 MG tablet  Daily        12/17/20 1217             Jeanell Sparrow, DO 12/17/20 1236

## 2020-12-17 NOTE — ED Triage Notes (Signed)
Pt having right upper back pain 2 days ago, lower right flank pain since this am.  Right testicular pain since yesterday afternoon.

## 2020-12-17 NOTE — ED Notes (Signed)
Pt states he has a headache

## 2020-12-17 NOTE — Discharge Instructions (Addendum)
Please follow up with PCP next week regarding your symptoms and epididymitis, hydrocele.

## 2020-12-17 NOTE — ED Notes (Signed)
US at bedside

## 2020-12-18 LAB — GC/CHLAMYDIA PROBE AMP (~~LOC~~) NOT AT ARMC
Chlamydia: NEGATIVE
Comment: NEGATIVE
Comment: NORMAL
Neisseria Gonorrhea: NEGATIVE

## 2020-12-19 LAB — URINE CULTURE: Culture: NO GROWTH

## 2021-02-23 ENCOUNTER — Emergency Department (HOSPITAL_BASED_OUTPATIENT_CLINIC_OR_DEPARTMENT_OTHER): Payer: BC Managed Care – PPO

## 2021-02-23 ENCOUNTER — Encounter (HOSPITAL_BASED_OUTPATIENT_CLINIC_OR_DEPARTMENT_OTHER): Payer: Self-pay | Admitting: *Deleted

## 2021-02-23 ENCOUNTER — Other Ambulatory Visit: Payer: Self-pay

## 2021-02-23 ENCOUNTER — Emergency Department (HOSPITAL_BASED_OUTPATIENT_CLINIC_OR_DEPARTMENT_OTHER)
Admission: EM | Admit: 2021-02-23 | Discharge: 2021-02-23 | Disposition: A | Discharge status: Home / Self Care | Payer: BC Managed Care – PPO | Attending: Emergency Medicine | Admitting: Emergency Medicine

## 2021-02-23 DIAGNOSIS — R066 Hiccough: Secondary | ICD-10-CM | POA: Insufficient documentation

## 2021-02-23 LAB — CBC WITH DIFFERENTIAL/PLATELET
Abs Immature Granulocytes: 0.09 10*3/uL — ABNORMAL HIGH (ref 0.00–0.07)
Basophils Absolute: 0 10*3/uL (ref 0.0–0.1)
Basophils Relative: 0 %
Eosinophils Absolute: 0.3 10*3/uL (ref 0.0–0.5)
Eosinophils Relative: 2 %
HCT: 47 % (ref 39.0–52.0)
Hemoglobin: 16 g/dL (ref 13.0–17.0)
Immature Granulocytes: 1 %
Lymphocytes Relative: 6 %
Lymphs Abs: 0.9 10*3/uL (ref 0.7–4.0)
MCH: 29.4 pg (ref 26.0–34.0)
MCHC: 34 g/dL (ref 30.0–36.0)
MCV: 86.2 fL (ref 80.0–100.0)
Monocytes Absolute: 1.3 10*3/uL — ABNORMAL HIGH (ref 0.1–1.0)
Monocytes Relative: 10 %
Neutro Abs: 10.7 10*3/uL — ABNORMAL HIGH (ref 1.7–7.7)
Neutrophils Relative %: 81 %
Platelets: 211 10*3/uL (ref 150–400)
RBC: 5.45 MIL/uL (ref 4.22–5.81)
RDW: 14.3 % (ref 11.5–15.5)
WBC: 13.2 10*3/uL — ABNORMAL HIGH (ref 4.0–10.5)
nRBC: 0 % (ref 0.0–0.2)

## 2021-02-23 LAB — COMPREHENSIVE METABOLIC PANEL
ALT: 43 U/L (ref 0–44)
AST: 52 U/L — ABNORMAL HIGH (ref 15–41)
Albumin: 3.9 g/dL (ref 3.5–5.0)
Alkaline Phosphatase: 54 U/L (ref 38–126)
Anion gap: 7 (ref 5–15)
BUN: 9 mg/dL (ref 6–20)
CO2: 24 mmol/L (ref 22–32)
Calcium: 8.5 mg/dL — ABNORMAL LOW (ref 8.9–10.3)
Chloride: 104 mmol/L (ref 98–111)
Creatinine, Ser: 1.21 mg/dL (ref 0.61–1.24)
GFR, Estimated: >60 mL/min (ref >60)
Glucose, Bld: 99 mg/dL (ref 70–99)
Potassium: 3.8 mmol/L (ref 3.5–5.1)
Sodium: 135 mmol/L (ref 135–145)
Total Bilirubin: 0.4 mg/dL (ref 0.3–1.2)
Total Protein: 7.1 g/dL (ref 6.5–8.1)

## 2021-02-23 LAB — LIPASE, BLOOD: Lipase: 26 U/L (ref 11–51)

## 2021-02-23 LAB — TROPONIN I (HIGH SENSITIVITY): Troponin I (High Sensitivity): 5 ng/L (ref <18)

## 2021-02-23 MED ORDER — ONDANSETRON HCL 4 MG/2ML IJ SOLN
4.0000 mg | Freq: Once | INTRAMUSCULAR | Status: AC
  Administered 2021-02-23: 4 mg via INTRAVENOUS
  Filled 2021-02-23: qty 2

## 2021-02-23 MED ORDER — PANTOPRAZOLE SODIUM 20 MG PO TBEC: 20.0000 mg | DELAYED_RELEASE_TABLET | Freq: Every day | ORAL | 0 refills | Status: AC

## 2021-02-23 MED ORDER — METOCLOPRAMIDE HCL 5 MG/ML IJ SOLN
10.0000 mg | Freq: Once | INTRAMUSCULAR | Status: AC
  Administered 2021-02-23: 10 mg via INTRAVENOUS
  Filled 2021-02-23: qty 2

## 2021-02-23 MED ORDER — METOCLOPRAMIDE HCL 10 MG PO TABS: 10.0000 mg | ORAL_TABLET | Freq: Four times a day (QID) | ORAL | 0 refills | Status: AC | PRN

## 2021-02-23 MED ORDER — CHLORPROMAZINE HCL 25 MG PO TABS
50.0000 mg | ORAL_TABLET | Freq: Once | ORAL | Status: AC
  Administered 2021-02-23: 50 mg via ORAL
  Filled 2021-02-23: qty 2

## 2021-02-23 NOTE — ED Notes (Signed)
Patient discharged to home.  All discharge instructions reviewed.  Patient verbalized understanding via teachback method.  VS WDL.  Respirations even and unlabored.  Ambulatory out of ED.   °

## 2021-02-23 NOTE — Discharge Instructions (Addendum)
As we discussed we did not find a clear reason why you are having persistent hiccups today.  It is most often due to irritation of your diaphragm which can be due to conditions of your heart, your lungs, or your stomach.  Your work-up today was reassuring for cardiac causes of diaphragmatic irritation including heart attack, you do not show any signs of pneumonia in your lungs.  It is difficult for [place] to assess for acid reflux, or esophagus irritation in the emergency department which is why recommend that you follow-up with gastroenterology for further evaluation.  In the meantime I am prescribing you 2 medications, first is Reglan which is a medication I recommend that you take at least once daily, but you can take up to every 6 hours for hiccups.  In addition please take this proton pump inhibitor, Protonix once daily.  If your hiccups worsen, or you have worsening chest pain or shortness of breath please return to the emergency department for further evaluation.

## 2021-02-23 NOTE — ED Notes (Signed)
Pt denies chest pain at this time, only "a sore throat."  No needs expressed, will continue to monitor.

## 2021-02-23 NOTE — ED Provider Notes (Signed)
[name] Provider Note   CSN: [id number] Arrival date & time: 02/23/21  1521     History  Chief Complaint  Patient presents with   Hiccups    2 weeks    Tony Owens is a 51 y.o. male This is a 51 year old male with a past medical history significant for asthma who presents with concern for persistent hiccups for the last 2 weeks.  Patient reports that initially there was some improvement with his [facility], and rescue inhaler, however no reaction at this point.  Patient denies history of acid reflux, stomach irritation.  Patient has had persistent nausea vomiting along with hiccups.  Patient reports that he is had worsening of the hiccups symptoms, feeling of fever, chills, as well as chest pain that began a couple of days ago.  Patient reports he had shortness of breath, feeling that he could not breathe last night, called EMS, administered his EpiPen, and was not taken to the hospital because his symptoms resolved after epinephrine.  Patient reports due to the persistent hiccups he has had significant insomnia for the last 2 weeks as well.  Nothing seems to help or make it better at this point.  HPI     Home Medications Prior to Admission medications   Medication Sig Start Date End Date Taking? Authorizing Provider  metoCLOPramide (REGLAN) 10 MG tablet Take 1 tablet (10 mg total) by mouth every 6 (six) hours as needed for nausea. 02/23/21  Yes Taino Maertens H, PA-C  pantoprazole (PROTONIX) 20 MG tablet Take 1 tablet (20 mg total) by mouth daily. 02/23/21  Yes Brendin Situ H, PA-C  fluticasone-salmeterol (ADVAIR HFA) 115-21 MCG/ACT inhaler Inhale 2 puffs into the lungs every morning.     [provider]  ibuprofen (ADVIL) 600 MG tablet Take 1 tablet (600 mg total) by mouth every 6 (six) hours as needed. 12/17/20   Jeanell Sparrow, DO  levofloxacin (LEVAQUIN) 500 MG tablet Take 1 tablet (500 mg total) by mouth daily. 12/17/20    Wynona Dove A, DO  MONTELUKAST SODIUM PO Take by mouth.    [provider]  ondansetron (ZOFRAN) 4 MG tablet Take 1 tablet (4 mg total) by mouth every 8 (eight) hours as needed for nausea or vomiting. 07/04/14   Dahlia Bailiff, PA-C  oxyCODONE-acetaminophen (ROXICET) 5-325 MG per tablet Take 1-2 tablets by mouth every 4 (four) hours as needed. Patient taking differently: Take 1-2 tablets by mouth every 4 (four) hours as needed for moderate pain. 07/02/14   Cornett, [name], MD  polyethylene glycol (MIRALAX / GLYCOLAX) packet Take 17 g by mouth daily. 07/04/14   Dahlia Bailiff, PA-C      Allergies    Patient has no known allergies.    Review of Systems   Review of Systems  Respiratory:  Positive for chest tightness and shortness of breath.        Hiccups  All other systems reviewed and are negative.  Physical Exam Updated Vital Signs BP 116/68    Pulse 72    Temp 98.9 F (37.2 C) (Oral)    Resp (!) 23    Ht 5\' 10"  (1.778 m)    Wt 99.8 kg    SpO2 96%    BMI 31.57 kg/m  Physical Exam Vitals and nursing note reviewed.  Constitutional:      General: He is in acute distress.     Appearance: Normal appearance.     Comments: Somewhat distressed appearing male who  is actively hiccuping for most of my evaluation other than brief time following IV medications during his evaluation.  HENT:     Head: Normocephalic and atraumatic.     Right Ear: Tympanic membrane and ear canal normal. There is no impacted cerumen.     Left Ear: Tympanic membrane and ear canal normal. There is no impacted cerumen.  Eyes:     General:        Right eye: No discharge.        Left eye: No discharge.  Cardiovascular:     Rate and Rhythm: Normal rate and regular rhythm.     Heart sounds: No murmur heard.   No friction rub. No gallop.  Pulmonary:     Effort: Pulmonary effort is normal.     Breath sounds: Normal breath sounds.     Comments: Patient with some distress secondary to difficulty breathing from pickups,  but good air movement throughout both lungs and between hiccups.  He does have a persistent cough which she says helps to get more air in his lungs between hiccups. Abdominal:     General: Bowel sounds are normal.     Palpations: Abdomen is soft.     Comments: No tenderness to palpation of the abdomen  Skin:    General: Skin is warm and dry.     Capillary Refill: Capillary refill takes less than 2 seconds.  Neurological:     Mental Status: He is alert and oriented to person, place, and time.  Psychiatric:        Mood and Affect: Mood normal.        Behavior: Behavior normal.    ED Results / Procedures / Treatments   Labs (all labs ordered are listed, but only abnormal results are displayed) Labs Reviewed  CBC WITH DIFFERENTIAL/PLATELET - Abnormal; Notable for the following components:      Result Value   WBC 13.2 (*)    Neutro Abs 10.7 (*)    Monocytes Absolute 1.3 (*)    Abs Immature Granulocytes 0.09 (*)    All other components within normal limits  COMPREHENSIVE METABOLIC PANEL - Abnormal; Notable for the following components:   Calcium 8.5 (*)    AST 52 (*)    All other components within normal limits  LIPASE, BLOOD  TROPONIN I (HIGH SENSITIVITY)    EKG EKG Interpretation  Date/Time:  Tuesday February 23 2021 16:07:36 EST Ventricular Rate:  88 PR Interval:  147 QRS Duration: 99 QT Interval:  324 QTC Calculation: 392 R Axis:   -25 Text Interpretation: Sinus rhythm Probable left atrial enlargement Borderline left axis deviation RSR' in V1 or V2, probably normal variant Abnormal T, consider ischemia, lateral leads Confirmed by Sherwood Gambler [phone]) on 02/23/2021 4:20:38 PM  Radiology DG Chest 2 View  Result Date: 02/23/2021 CLINICAL DATA:  Chest pain with shortness of breath. Hip cups with vomiting and insomnia for 2 weeks. EXAM: CHEST - 2 VIEW COMPARISON:  Radiographs 07/08/2010. FINDINGS: Mild motion artifact on the lateral view. The heart size and mediastinal  contours are normal. The lungs are clear. There is no pleural effusion or pneumothorax. No acute osseous findings are identified. No asymmetric diaphragm elevation. Telemetry leads overlie the chest. IMPRESSION: Stable chest without evidence of active cardiopulmonary process. Electronically Signed   By: Richardean Sale M.D.   On: 02/23/2021 16:31    Procedures Procedures    Medications Ordered in ED Medications  metoCLOPramide (REGLAN) injection 10 mg (10 mg Intravenous Given  02/23/21 1658)  ondansetron (ZOFRAN) injection 4 mg (4 mg Intravenous Given 02/23/21 1657)  chlorproMAZINE (THORAZINE) tablet 50 mg (50 mg Oral Given 02/23/21 1957)    ED Course/ Medical Decision Making/ A&P                            Medical Decision Making Amount and/or Complexity of Data Reviewed Labs: ordered. Radiology: ordered.  Risk Prescription drug management.   I discussed this case with my attending physician who cosigned this note including patient's presenting symptoms, physical exam, and planned diagnostics and interventions. Attending physician stated agreement with plan or made changes to plan which were implemented.   Attending physician assessed patient at bedside.  This patient presents to the ED for concern of hiccups, shortness of breath for 2 weeks, this involves an extensive number of treatment options, and is a complaint that carries with it a high risk of complications and morbidity. The emergent differential diagnosis includes, but is not limited to, diaphragm irritation secondary to pneumonia, mass, foreign body in ear, with some chest pain, shortness of breath worried about cardiac pulmonary cause of diaphragmatic irritation.  This is not an exhaustive differential.   Co morbidities that complicate the patient evaluation: Chronic asthma  Additional history obtained from his wife.  Physical Exam: Physical exam performed. The pertinent findings include: Active hiccups, dry cough,  otherwise normal lung sounds, normal cardiac exam, no abdominal tenderness.  Lab Tests: I Ordered, and personally interpreted labs.  The pertinent results include: Some leukocytosis with mild neutrophil predominance.  Very mild elevation of AST.  No other abnormalities of CBC, CMP, lipase.  He has negative troponin x1 in context of shortness of breath, chest pain is been ongoing for 2 weeks at this time.   Imaging Studies: I ordered imaging studies including chest x-ray. I independently visualized and interpreted imaging which showed no acute cardiopulmonary abnormality. I agree with the radiologist interpretation.   Cardiac Monitoring:  The patient was maintained on a cardiac monitor.  My attending physician Dr. Regenia Skeeter viewed and interpreted the cardiac monitored which showed an underlying rhythm of: sinus rhythm with nonspecific T wave abnormality.   Medications: I ordered medication including reglan, zofran, thorazine  for hiccups. Reevaluation of the patient after these medicines showed that the patient had mild improvement of hiccups for around 1 hour after initial IV medications, and 1 episode of vomiting, with return of hiccups at normal frequency after some time.  After consulting with pharmacy they do not have any additional recommendations other than potentially baclofen, gabapentin, and Thorazine.  Discussed with patient, we will administer Thorazine discharged to help with symptoms.   Disposition: After consideration of the diagnostic results and the patients response to treatment, I feel that patient continues to have ongoing hiccups, unclear source of diaphragmatic irritation as patient has clear ears, no evidence of cardiopulmonary abnormality on chest x-ray, and inability to evaluate esophagus and stomach in the emergency department today.  Discussed that after review of the literature can take sometimes up to weeks of persistent use of Reglan, PPI, and sometimes other medications to  resolve hiccups of this nature.  Minimal clinical concern for other occult cause of hiccups, discussed the patient does need further follow-up.  Will discharge with prescriptions for Reglan, Zofran at this time, and strict return precautions are given.  Patient discharged in stable condition at this time, hiccups ongoing.  Final Clinical Impression(s) / ED Diagnoses Final diagnoses:  Chronic hiccups    Rx / DC Orders ED Discharge Orders          Ordered    metoCLOPramide (REGLAN) 10 MG tablet  Every 6 hours PRN        02/23/21 1822    pantoprazole (PROTONIX) 20 MG tablet  Daily        02/23/21 1822              Dorien Chihuahua 02/23/21 2020    Sherwood Gambler, MD 02/27/21 [phone]

## 2021-02-23 NOTE — ED Notes (Signed)
Pt reports that his hiccups have returned, feels very uncomfortable.  EDP Christian made aware.

## 2021-02-23 NOTE — ED Triage Notes (Addendum)
Pt c/o hiccups / vomiting/ insomnia  x 2 weeks

## 2021-02-25 ENCOUNTER — Other Ambulatory Visit (HOSPITAL_COMMUNITY): Payer: Self-pay | Admitting: Gastroenterology

## 2021-02-25 ENCOUNTER — Other Ambulatory Visit: Payer: Self-pay | Admitting: Gastroenterology

## 2021-02-25 DIAGNOSIS — R066 Hiccough: Secondary | ICD-10-CM

## 2021-02-25 DIAGNOSIS — R634 Abnormal weight loss: Secondary | ICD-10-CM

## 2021-02-25 DIAGNOSIS — R112 Nausea with vomiting, unspecified: Secondary | ICD-10-CM

## 2021-02-25 DIAGNOSIS — R1013 Epigastric pain: Secondary | ICD-10-CM

## 2021-03-01 ENCOUNTER — Ambulatory Visit (HOSPITAL_COMMUNITY)
Admission: RE | Admit: 2021-03-01 | Discharge: 2021-03-01 | Disposition: A | Discharge status: Home / Self Care | Payer: BC Managed Care – PPO | Source: Ambulatory Visit | Attending: Gastroenterology | Admitting: Gastroenterology

## 2021-03-01 ENCOUNTER — Encounter (HOSPITAL_COMMUNITY): Payer: Self-pay

## 2021-03-01 ENCOUNTER — Other Ambulatory Visit: Payer: Self-pay

## 2021-03-01 DIAGNOSIS — R634 Abnormal weight loss: Secondary | ICD-10-CM | POA: Diagnosis present

## 2021-03-01 DIAGNOSIS — R112 Nausea with vomiting, unspecified: Secondary | ICD-10-CM | POA: Insufficient documentation

## 2021-03-01 DIAGNOSIS — R1013 Epigastric pain: Secondary | ICD-10-CM | POA: Diagnosis present

## 2021-03-01 DIAGNOSIS — R066 Hiccough: Secondary | ICD-10-CM | POA: Insufficient documentation

## 2021-03-01 MED ORDER — SODIUM CHLORIDE (PF) 0.9 % IJ SOLN
INTRAMUSCULAR | Status: AC
  Filled 2021-03-01: qty 50

## 2021-03-01 MED ORDER — IOHEXOL 300 MG/ML  SOLN
100.0000 mL | Freq: Once | INTRAMUSCULAR | Status: AC | PRN
  Administered 2021-03-01: 100 mL via INTRAVENOUS

## 2023-02-07 ENCOUNTER — Emergency Department (HOSPITAL_BASED_OUTPATIENT_CLINIC_OR_DEPARTMENT_OTHER)
Admission: EM | Admit: 2023-02-07 | Discharge: 2023-02-07 | Disposition: A | Discharge status: Home / Self Care | Payer: 59 | Attending: Emergency Medicine | Admitting: Emergency Medicine

## 2023-02-07 ENCOUNTER — Encounter (HOSPITAL_BASED_OUTPATIENT_CLINIC_OR_DEPARTMENT_OTHER): Payer: Self-pay | Admitting: Radiology

## 2023-02-07 ENCOUNTER — Other Ambulatory Visit: Payer: Self-pay

## 2023-02-07 ENCOUNTER — Emergency Department (HOSPITAL_BASED_OUTPATIENT_CLINIC_OR_DEPARTMENT_OTHER): Payer: 59

## 2023-02-07 DIAGNOSIS — M79604 Pain in right leg: Secondary | ICD-10-CM | POA: Diagnosis not present

## 2023-02-07 DIAGNOSIS — M546 Pain in thoracic spine: Secondary | ICD-10-CM | POA: Insufficient documentation

## 2023-02-07 DIAGNOSIS — M545 Low back pain, unspecified: Secondary | ICD-10-CM | POA: Diagnosis present

## 2023-02-07 DIAGNOSIS — M79605 Pain in left leg: Secondary | ICD-10-CM | POA: Insufficient documentation

## 2023-02-07 MED ORDER — CYCLOBENZAPRINE HCL 10 MG PO TABS
10.0000 mg | ORAL_TABLET | Freq: Three times a day (TID) | ORAL | 0 refills | Status: AC | PRN
Start: 2023-02-07 — End: ?

## 2023-02-07 MED ORDER — LIDOCAINE 5 % EX PTCH: 1.0000 | MEDICATED_PATCH | CUTANEOUS | 0 refills | Status: AC
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
